# Patient Record
Sex: Male | Born: 1965 | Race: Black or African American | Hispanic: No | Smoking: Current every day smoker
Health system: Southern US, Community
[De-identification: ages and names within clinical notes are randomized; demographics above are authoritative.]

## PROBLEM LIST (undated history)

## (undated) DIAGNOSIS — G709 Myoneural disorder, unspecified: Secondary | ICD-10-CM

## (undated) DIAGNOSIS — M1990 Unspecified osteoarthritis, unspecified site: Secondary | ICD-10-CM

## (undated) DIAGNOSIS — L0390 Cellulitis, unspecified: Secondary | ICD-10-CM

## (undated) HISTORY — DX: Myoneural disorder, unspecified: G70.9

---

## 1997-09-01 ENCOUNTER — Emergency Department (HOSPITAL_COMMUNITY): Admission: EM | Admit: 1997-09-01 | Discharge: 1997-09-01 | Payer: Self-pay | Admitting: Emergency Medicine

## 1997-09-08 ENCOUNTER — Emergency Department (HOSPITAL_COMMUNITY): Admission: EM | Admit: 1997-09-08 | Discharge: 1997-09-08 | Payer: Self-pay | Admitting: Emergency Medicine

## 1999-07-20 ENCOUNTER — Encounter: Payer: Self-pay | Admitting: Emergency Medicine

## 1999-07-20 ENCOUNTER — Emergency Department (HOSPITAL_COMMUNITY): Admission: EM | Admit: 1999-07-20 | Discharge: 1999-07-20 | Payer: Self-pay | Admitting: Emergency Medicine

## 2000-08-31 ENCOUNTER — Encounter: Payer: Self-pay | Admitting: Emergency Medicine

## 2000-08-31 ENCOUNTER — Emergency Department (HOSPITAL_COMMUNITY): Admission: EM | Admit: 2000-08-31 | Discharge: 2000-08-31 | Payer: Self-pay | Admitting: Emergency Medicine

## 2001-02-17 ENCOUNTER — Emergency Department (HOSPITAL_COMMUNITY): Admission: EM | Admit: 2001-02-17 | Discharge: 2001-02-17 | Payer: Self-pay

## 2002-07-22 ENCOUNTER — Inpatient Hospital Stay (HOSPITAL_COMMUNITY): Admission: AC | Admit: 2002-07-22 | Discharge: 2002-07-26 | Payer: Self-pay

## 2002-07-22 ENCOUNTER — Encounter: Payer: Self-pay | Admitting: General Surgery

## 2002-07-23 ENCOUNTER — Encounter: Payer: Self-pay | Admitting: General Surgery

## 2002-07-24 ENCOUNTER — Encounter: Payer: Self-pay | Admitting: General Surgery

## 2002-07-25 ENCOUNTER — Encounter: Payer: Self-pay | Admitting: General Surgery

## 2002-07-26 ENCOUNTER — Encounter: Payer: Self-pay | Admitting: General Surgery

## 2002-08-01 ENCOUNTER — Emergency Department (HOSPITAL_COMMUNITY): Admission: EM | Admit: 2002-08-01 | Discharge: 2002-08-01 | Payer: Self-pay | Admitting: Emergency Medicine

## 2002-08-05 ENCOUNTER — Encounter: Payer: Self-pay | Admitting: Emergency Medicine

## 2002-08-05 ENCOUNTER — Inpatient Hospital Stay (HOSPITAL_COMMUNITY): Admission: EM | Admit: 2002-08-05 | Discharge: 2002-08-07 | Payer: Self-pay | Admitting: Emergency Medicine

## 2002-08-06 ENCOUNTER — Encounter: Payer: Self-pay | Admitting: General Surgery

## 2002-08-20 ENCOUNTER — Encounter: Payer: Self-pay | Admitting: Emergency Medicine

## 2002-08-21 ENCOUNTER — Inpatient Hospital Stay (HOSPITAL_COMMUNITY): Admission: AD | Admit: 2002-08-21 | Discharge: 2002-08-21 | Payer: Self-pay | Admitting: Emergency Medicine

## 2002-08-24 ENCOUNTER — Emergency Department (HOSPITAL_COMMUNITY): Admission: EM | Admit: 2002-08-24 | Discharge: 2002-08-25 | Payer: Self-pay | Admitting: Emergency Medicine

## 2002-10-20 ENCOUNTER — Encounter: Payer: Self-pay | Admitting: Emergency Medicine

## 2002-10-20 ENCOUNTER — Emergency Department (HOSPITAL_COMMUNITY): Admission: EM | Admit: 2002-10-20 | Discharge: 2002-10-20 | Payer: Self-pay | Admitting: *Deleted

## 2005-03-15 ENCOUNTER — Emergency Department: Payer: Self-pay | Admitting: Emergency Medicine

## 2006-11-07 ENCOUNTER — Emergency Department: Payer: Self-pay

## 2006-12-17 ENCOUNTER — Emergency Department: Payer: Self-pay | Admitting: Emergency Medicine

## 2016-12-21 ENCOUNTER — Emergency Department (HOSPITAL_COMMUNITY): Payer: Self-pay

## 2016-12-21 ENCOUNTER — Emergency Department (HOSPITAL_COMMUNITY)
Admission: EM | Admit: 2016-12-21 | Discharge: 2016-12-21 | Disposition: A | Discharge status: Home / Self Care | Payer: Self-pay | Attending: Emergency Medicine | Admitting: Emergency Medicine

## 2016-12-21 ENCOUNTER — Encounter (HOSPITAL_COMMUNITY): Payer: Self-pay

## 2016-12-21 DIAGNOSIS — Z5321 Procedure and treatment not carried out due to patient leaving prior to being seen by health care provider: Secondary | ICD-10-CM | POA: Insufficient documentation

## 2016-12-21 HISTORY — DX: Unspecified osteoarthritis, unspecified site: M19.90

## 2016-12-21 HISTORY — DX: Cellulitis, unspecified: L03.90

## 2016-12-21 NOTE — ED Notes (Addendum)
Called patient for vital sign recheck, no answer.

## 2016-12-21 NOTE — ED Triage Notes (Signed)
Pt states he has had right neck and shoulder pain with numbness into the arm X2 months. Pt states he has hx of cellulitis in the arm and lost use of the extremity. Pt afebrile. No swelling noted to the extremity.

## 2017-01-04 ENCOUNTER — Emergency Department (HOSPITAL_COMMUNITY): Payer: Self-pay

## 2017-01-04 ENCOUNTER — Emergency Department (HOSPITAL_COMMUNITY)
Admission: EM | Admit: 2017-01-04 | Discharge: 2017-01-04 | Disposition: A | Discharge status: Home / Self Care | Payer: Self-pay | Attending: Emergency Medicine | Admitting: Emergency Medicine

## 2017-01-04 ENCOUNTER — Encounter (HOSPITAL_COMMUNITY): Payer: Self-pay

## 2017-01-04 DIAGNOSIS — F17210 Nicotine dependence, cigarettes, uncomplicated: Secondary | ICD-10-CM | POA: Insufficient documentation

## 2017-01-04 DIAGNOSIS — M5412 Radiculopathy, cervical region: Secondary | ICD-10-CM | POA: Insufficient documentation

## 2017-01-04 MED ORDER — PREDNISONE 10 MG PO TABS: 40.0000 mg | ORAL_TABLET | Freq: Every day | ORAL | 0 refills | Status: AC

## 2017-01-04 MED ORDER — METHOCARBAMOL 500 MG PO TABS: 500.0000 mg | ORAL_TABLET | Freq: Three times a day (TID) | ORAL | 0 refills | Status: AC | PRN

## 2017-01-04 NOTE — ED Provider Notes (Signed)
[facility] Provider Note   CSN: [id number] Arrival date & time: 01/04/17  1205     History   Chief Complaint Chief Complaint  Patient presents with  . Shoulder Pain    HPI Carl Sharp is a 51 y.o. male with history of right hip osteoarthritis presents to the ED for evaluation of gradually worsening right-sided neck pain that radiates to right shoulder and right hand associated with "muscle tightness" in the right trapezius and intermittent tingling to thumb, index and middle fingers of the right hand. Pain is exacerbated with right neck rotation and bend, palpation and active movement of the right upper extremity above head level.States sometimes when he is trying to write things down he has difficulty grasping onto the pen. No numbness or weakness in RUE. Has been taking 600 mg of ibuprofen and 650 mg of Tylenol with minimal relief.Denies neck injury or trauma to neck or right upper extremity. He began using a cane on the right side in the last few months due to pain in his right hip from OA, he thinks that overusing his right upper extremity may be exacerbating his symptoms.  No fevers, chills, neck stiffness, headaches, generalized rashes, history of IV drug use, history of epidural abscesses, numbness or weakness to RUE. No previous injury or surgery to cervical spine.  HPI  Past Medical History:  Diagnosis Date  . Arthritis   . Cellulitis     There are no active problems to display for this patient.   History reviewed. No pertinent surgical history.     Home Medications    Prior to Admission medications   Medication Sig Start Date End Date Taking? Authorizing Provider  methocarbamol (ROBAXIN) 500 MG tablet Take 1 tablet (500 mg total) every 8 (eight) hours as needed for up to 5 days by mouth for muscle spasms. 01/04/17 01/09/17  Liberty HandyGibbons, Tayvin Preslar J, PA-C  predniSONE (DELTASONE) 10 MG tablet Take 4 tablets (40 mg total) daily  for 5 days by mouth. 01/04/17 01/09/17  Liberty HandyGibbons, Jury Caserta J, PA-C    Family History History reviewed. No pertinent family history.  Social History Social History   Tobacco Use  . Smoking status: Current Every Day Smoker    Packs/day: 0.25    Types: Cigarettes  . Smokeless tobacco: Never Used  Substance Use Topics  . Alcohol use: Yes    Comment: social   . Drug use: No     Allergies   Other   Review of Systems Review of Systems  Constitutional: Negative for chills and fever.  Musculoskeletal: Positive for arthralgias, myalgias and neck pain. Negative for back pain, gait problem and neck stiffness.  Skin: Negative for rash and wound.  Neurological: Negative for weakness, numbness and headaches.       +tingling in right index, middle, thumb     Physical Exam Updated Vital Signs BP 123/79   Pulse 79   Temp 98.6 F (37 C) (Oral)   Resp 18   Ht 6\' 1"  (1.854 m)   Wt 93 kg (205 lb)   SpO2 100%   BMI 27.05 kg/m   Physical Exam  Constitutional: He is oriented to person, place, and time. He appears well-developed and well-nourished. No distress.  NAD.  HENT:  Head: Normocephalic and atraumatic.  Right Ear: External ear normal.  Left Ear: External ear normal.  Nose: Nose normal.  Eyes: Conjunctivae are normal. No scleral icterus.  PERRL and EOMs intact bilaterally  Neck: Neck  supple. Spinous process tenderness and muscular tenderness present. Decreased range of motion present.  Midline c-spine tenderness along C5-6 w/o step offs or crepitus R sided paraspinal c-spine muscular tenderness Full PROM of neck, pain reported with right neck bend and rotation No meningeal signs   Cardiovascular: Normal rate, regular rhythm, normal heart sounds and intact distal pulses.  No murmur heard. Pulmonary/Chest: Effort normal and breath sounds normal. He has no wheezes.  Musculoskeletal: He exhibits no deformity.       Right shoulder: He exhibits decreased range of motion and  tenderness.  Negative Adson's, ROOST and Spurling's, lift off Positive empty can test, hawkin's and neer's Diffuse tenderness along R trapezius and lateral deltoid Full PROM of right shoulder, pain with ABD and extension Full and painless PROM of R elbow and wrist   Neurological: He is alert and oriented to person, place, and time.  Sensation to light touch intact in medial, ulnar and radial nerve distribution in RUE 5/5 strength with hand grip bilaterally   Skin: Skin is warm and dry. Capillary refill takes less than 2 seconds.  Psychiatric: He has a normal mood and affect. His behavior is normal. Judgment and thought content normal.  Nursing note and vitals reviewed.    ED Treatments / Results  Labs (all labs ordered are listed, but only abnormal results are displayed) Labs Reviewed - No data to display  EKG  EKG Interpretation None       Radiology Dg Shoulder Right  Result Date: 01/04/2017 CLINICAL DATA:  Right shoulder pain EXAM: RIGHT SHOULDER - 2+ VIEW COMPARISON:  12/21/2016 FINDINGS: Normal alignment and no fracture. Mild degenerative change in the [facility] joint otherwise negative IMPRESSION: Mild AC degenerative change.  No acute abnormality. Electronically Signed   By: Marlan Palauharles  Clark M.D.   On: 01/04/2017 13:08   Ct Cervical Spine Wo Contrast  Result Date: 01/04/2017 CLINICAL DATA:  51 year old male with right shoulder and arm pain. Recently diagnosed with right hip osteoarthritis an uses right-sided cane. EXAM: CT CERVICAL SPINE WITHOUT CONTRAST TECHNIQUE: Multidetector CT imaging of the cervical spine was performed without intravenous contrast. Multiplanar CT image reconstructions were also generated. COMPARISON:  None. FINDINGS: Alignment: Slight straightening and minimal curvature cervical spine convex right. Slight rotation of C1 upon C2 felt be related head positioning. Skull base and vertebrae: No cervical spine fracture. Soft tissues and spinal canal: No abnormal  prevertebral soft tissue swelling. Disc levels: Minimal degenerative changes C1-2 articulation. Cervicomedullary junction unremarkable. C2-3:  Negative. C3-4:  Negative. C4-5:  Minimal uncinate hypertrophy.  Minimal foraminal narrowing. C5-6:  Mild bulge.  Mild narrowing ventral thecal sac. C6-7: Bulge slightly greater to left. Mild narrowing ventral thecal sac greater on left. Mild left foraminal narrowing. C7-T1: Minimal bulge. Minimal spur. Right uncinate hypertrophy. Mild right foraminal narrowing. Upper chest: Negative Other: Negative. IMPRESSION: C4-5 minimal uncinate hypertrophy. Minimal bilateral foraminal narrowing. C5-6 mild bulge.  Mild narrowing ventral thecal sac. C6-7 bulge slightly greater to left. Mild narrowing ventral thecal sac greater on left. Mild left foraminal narrowing. C7-T1 minimal bulge. Minimal spur. Right uncinate hypertrophy. Mild right foraminal narrowing. Slight straightening and minimal curvature cervical spine convex right. Slight rotation of C1 upon  C2 felt be related head positioning. No cervical spine fracture. Electronically Signed   By: Lacy DuverneySteven  Olson M.D.   On: 01/04/2017 14:58    Procedures Procedures (including critical care time)  Medications Ordered in ED Medications - No data to display   Initial Impression / Assessment and Plan /  ED Course  I have reviewed the triage vital signs and the nursing notes.  Pertinent labs & imaging results that were available during my care of the patient were reviewed by me and considered in my medical decision making (see chart for details).     51 y.o. yo male with pertinent pmh presents with right sided neck pain associated with paresthesias down to thumb, index and middle finger for several months.  Recently began using cane on right hand for right hip OA, but no other antecedent event, fall, injury to neck or RUE. VS reassuring.  There is tenderness between spinous processes of c-spine and right paraspinal muscles.   There is no focal weakness to RUE.  No gait disturbances, sensory loss, weakness, muscle atrophy.  No recent fevers, chills, immunosuppression, cancer or IVDU.  CT cervical spine today shows mild foraminal stenosis to R and L side at C7-T1.  Given first time symptoms, reassuring physical exam findings will treat for cervical radiculopathy with oral analgesics, prednisone and muscle relaxers.  Advised patient to avoid aggravating activities until symptoms start to improve.  Educated patient on red flag symptoms that would warrant return to ED, patient verbalized understanding.  Patient advised to f/u with PCP for possibly PT and long term treatment of symptoms.   Final Clinical Impressions(s) / ED Diagnoses   Final diagnoses:  Cervical radiculopathy    ED Discharge Orders        Ordered    predniSONE (DELTASONE) 10 MG tablet  Daily     01/04/17 1528    methocarbamol (ROBAXIN) 500 MG tablet  Every 8 hours PRN     01/04/17 1528       Liberty Handy, PA-C 01/04/17 2040    Melene Plan, DO 01/05/17 1504

## 2017-01-04 NOTE — Discharge Instructions (Signed)
Our CT scan of your cervical spine showed some mild disc degenerative changes commonly seen with aging. You have very mild narrowing at nerve C6-C7 and C7-T1 which could explain your symptoms. Given CT scan, symptoms and exam I suspect you have cervical radiculopathy, this happens when there is an inflamed nerve in your cervical spine. This is similar to sciatica that you have on your right leg.  Please take prednisone for anti-inflammatory properties. Take robaxin 500 mg three times daily to help with associated muscle spasms and tightness to your trapezius and neck.  For pain, take 600 mg ibuprofen and 1000 mg acetaminophen every 6-8 hours. Follow-up with your primary care provider within one to 2 weeks for reevaluation.  Contact [facility] to establish care with a primary care provider for regular, routine medical care.  This clinic accepts patients without medical insurance. A primary care provider can adjust your daily medications, give you refills, send referrals to specialists and perform physical exams and wellness checks.

## 2017-01-04 NOTE — ED Notes (Signed)
Patient transported to CT 

## 2017-01-04 NOTE — ED Triage Notes (Signed)
Pt arrived from home c/o right shoulder and arm pain.  States he was recently diagnosed with right hip osteoarthritis and uses a cane on right side also.

## 2017-03-30 ENCOUNTER — Emergency Department (HOSPITAL_COMMUNITY)
Admission: EM | Admit: 2017-03-30 | Discharge: 2017-03-30 | Disposition: A | Discharge status: Home / Self Care | Payer: Self-pay | Attending: Emergency Medicine | Admitting: Emergency Medicine

## 2017-03-30 ENCOUNTER — Other Ambulatory Visit: Payer: Self-pay

## 2017-03-30 ENCOUNTER — Encounter (HOSPITAL_COMMUNITY): Payer: Self-pay

## 2017-03-30 DIAGNOSIS — F17210 Nicotine dependence, cigarettes, uncomplicated: Secondary | ICD-10-CM | POA: Insufficient documentation

## 2017-03-30 DIAGNOSIS — M5431 Sciatica, right side: Secondary | ICD-10-CM | POA: Insufficient documentation

## 2017-03-30 DIAGNOSIS — M1611 Unilateral primary osteoarthritis, right hip: Secondary | ICD-10-CM | POA: Insufficient documentation

## 2017-03-30 MED ORDER — METHYLPREDNISOLONE 4 MG PO TBPK: ORAL_TABLET | ORAL | 0 refills | Status: AC

## 2017-03-30 NOTE — ED Provider Notes (Signed)
[facility] Provider Note   CSN: [id number] Arrival date & time: 03/30/17  [id number]     History   Chief Complaint No chief complaint on file.   HPI Carl Sharp is a 52 y.o. male with past medical history of osteoarthritis, who presents to ED for evaluation of continued right hip pain.  He states that he was diagnosed with osteoarthritis of the right hip in 2018.  He has been taking his home narcotic pain medication with no improvement in his symptoms.  He states that now he is having sharp shooting pain from the right side of his back down to his feet.  His pain has been constant and worse with movement.  He has been using a cane to ambulate since last year.  Denies any additional injuries or falls.  He denies any midline back pain, numbness to legs, loss of bowel or bladder function, prior back surgeries, history of cancer, history of IV drug use or fevers.  HPI  Past Medical History:  Diagnosis Date  . Arthritis   . Cellulitis     There are no active problems to display for this patient.   History reviewed. No pertinent surgical history.     Home Medications    Prior to Admission medications   Medication Sig Start Date End Date Taking? Authorizing Provider  methylPREDNISolone (MEDROL DOSEPAK) 4 MG TBPK tablet Taper over 6 days. 03/30/17   Dietrich PatesKhatri, Ceaira Ernster, PA-C    Family History No family history on file.  Social History Social History   Tobacco Use  . Smoking status: Current Every Day Smoker    Packs/day: 0.25    Types: Cigarettes  . Smokeless tobacco: Never Used  Substance Use Topics  . Alcohol use: Yes    Comment: social   . Drug use: No     Allergies   Other   Review of Systems Review of Systems  Constitutional: Negative for chills and fever.  Musculoskeletal: Positive for arthralgias, gait problem and myalgias. Negative for back pain, joint swelling, neck pain and neck stiffness.  Skin: Negative for color  change and wound.  Neurological: Negative for weakness and numbness.     Physical Exam Updated Vital Signs BP 127/90 (BP Location: Right Arm)   Pulse 88   Temp (!) 97.3 F (36.3 C) (Oral)   Resp 18   SpO2 99%   Physical Exam  Constitutional: He appears well-developed and well-nourished. No distress.  Nontoxic appearing and in no acute distress.  Ambulatory with cane.  HENT:  Head: Normocephalic and atraumatic.  Eyes: Conjunctivae and EOM are normal. No scleral icterus.  Neck: Normal range of motion.  Pulmonary/Chest: Effort normal. No respiratory distress.  Musculoskeletal:       Legs: Able to perform full active and passive range of motion of right hip and knee with no visible deformity or pelvic instability noted.  No midline spinal tenderness present in lumbar, thoracic or cervical spine. No step-off palpated. No visible bruising, edema or temperature change noted. No objective signs of numbness present. No saddle anesthesia. 2+ DP pulses bilaterally. Sensation intact to light touch. Strength 5/5 in bilateral lower extremities.  Neurological: He is alert.  Skin: No rash noted. He is not diaphoretic.  Psychiatric: He has a normal mood and affect.  Nursing note and vitals reviewed.    ED Treatments / Results  Labs (all labs ordered are listed, but only abnormal results are displayed) Labs Reviewed - No data to display  EKG  EKG Interpretation None       Radiology No results found.  Procedures Procedures (including critical care time)  Medications Ordered in ED Medications - No data to display   Initial Impression / Assessment and Plan / ED Course  I have reviewed the triage vital signs and the nursing notes.  Pertinent labs & imaging results that were available during my care of the patient were reviewed by me and considered in my medical decision making (see chart for details).     Patient presents to ED for evaluation of ongoing right hip pain due to  osteoarthritis.  He denies any new injuries, trauma to area, midline back pain, prior back surgeries, history of cancer, history of IV drug use, loss of sensation or loss of bowel or bladder function.  He states that his knee symptoms include sharp, shooting pain originating from his right buttock down to his right leg.  He takes home narcotic pain medication with no improvement in his symptoms.  He has discussed with his pain specialist that a hip replacement surgery could be that the only cure to his symptoms.  He has no midline spinal tenderness present or signs of loss of sensation on exam.  He has been ambulatory with cane since last year.  I suspect sciatica and osteoarthritis as a cause of his pain rather than infectious or injury to spinal cord as a cause of his symptoms.  Will give steroids and advised him to continue his home medications as previously prescribed.  Advised heat therapy and stretching as well.  Patient appears stable for discharge at this time.  Strict return precautions given.  Portions of this note were generated with Scientist, clinical (histocompatibility and immunogenetics). Dictation errors may occur despite best attempts at proofreading.   Final Clinical Impressions(s) / ED Diagnoses   Final diagnoses:  Sciatica of right side  Osteoarthritis of right hip, unspecified osteoarthritis type    ED Discharge Orders        Ordered    methylPREDNISolone (MEDROL DOSEPAK) 4 MG TBPK tablet     03/30/17 [id number] PA-C 03/30/17 1052    Azalia Bilis, MD 03/30/17 1659

## 2017-03-30 NOTE — Discharge Instructions (Signed)
Please read attached information regarding her condition. Use steroids and taper Dosepak as directed. Continue your home medications as previously prescribed. Follow-up with orthopedic specialist listed below for further evaluation. Return to ED for worsening symptoms, severe back pain, injuries or falls, loss of bowel or bladder function, numbness in legs.

## 2017-03-30 NOTE — ED Triage Notes (Signed)
Patient complains of ongoing arthritis pain to right hip. Diagnosed in 2018. Pain with ROM, no new trauma. Alert and oriented, pain radiating down back of right leg

## 2017-04-05 ENCOUNTER — Ambulatory Visit (INDEPENDENT_AMBULATORY_CARE_PROVIDER_SITE_OTHER): Payer: Self-pay | Admitting: Orthopaedic Surgery

## 2017-04-05 ENCOUNTER — Ambulatory Visit (INDEPENDENT_AMBULATORY_CARE_PROVIDER_SITE_OTHER): Payer: Self-pay

## 2017-04-05 ENCOUNTER — Encounter (INDEPENDENT_AMBULATORY_CARE_PROVIDER_SITE_OTHER): Payer: Self-pay | Admitting: Orthopaedic Surgery

## 2017-04-05 DIAGNOSIS — M25551 Pain in right hip: Secondary | ICD-10-CM

## 2017-04-05 DIAGNOSIS — M5416 Radiculopathy, lumbar region: Secondary | ICD-10-CM

## 2017-04-05 NOTE — Progress Notes (Signed)
Office Visit Note   Patient: Carl Sharp           Date of Birth: 12/15/1965           MRN: [id number] Visit Date: 04/05/2017              Requested by: No referring provider defined for this encounter. PCP: Patient, No Pcp Per   Assessment & Plan: Visit Diagnoses:  1. Pain in right hip   2. Lumbar radiculopathy     Plan: Impression is lumbar radiculopathy possible hip arthritis.  Recommend MRI of lumbar spine to rule out structural abnormalities.  Follow-up after the MRI.  Follow-Up Instructions: Return in about 2 weeks (around 04/19/2017).   Orders:  Orders Placed This Encounter  Procedures  . XR HIP UNILAT W OR W/O PELVIS 2-3 VIEWS RIGHT  . XR Lumbar Spine 2-3 Views  . MR Lumbar Spine w/o contrast   No orders of the defined types were placed in this encounter.     Procedures: No procedures performed   Clinical Data: No additional findings.   Subjective: Chief Complaint  Patient presents with  . Right Hip - Pain  . Lower Back - Pain    Patient is a 52 year old gentleman who comes in with right hip and radicular pain.  He endorses pain that radiates to the leg and into the groin.  His pain is radicular and will sometimes radiate into both legs.  Denies any injuries.  Endorses numbness and tingling.  He has worsening pain with increased activity.  He is ambulating with a cane.  [place]    Review of Systems  Constitutional: Negative.   All other systems reviewed and are negative.    Objective: Vital Signs: There were no vitals taken for this visit.  Physical Exam  Constitutional: He is oriented to person, place, and time. He appears well-developed and well-nourished.  HENT:  Head: Normocephalic and atraumatic.  Eyes: Pupils are equal, round, and reactive to light.  Neck: Neck supple.  Pulmonary/Chest: Effort normal.  Abdominal: Soft.  Musculoskeletal: Normal range of motion.  Neurological: He is alert and oriented  to person, place, and time.  Skin: Skin is warm.  Psychiatric: He has a normal mood and affect. His behavior is normal. Judgment and thought content normal.  Nursing note and vitals reviewed.   Ortho Exam Right hip exam shows minimal discomfort with rotation.  He has mild tenderness along the trochanteric bursa.  Negative sciatic tension sign.  Slight weakness with hip flexion but mainly due to poor participation and guarding. Specialty Comments:  No specialty comments available.  Imaging: Xr Hip Unilat W Or W/o Pelvis 2-3 Views Right  Result Date: 04/05/2017 No acute or structural abnormalities.  Minimal joint space narrowing of his hip joints  Xr Lumbar Spine 2-3 Views  Result Date: 04/05/2017 Advanced degenerative disc disease L4-L5.  Mild degenerative disc disease at other levels.    PMFS History: There are no active problems to display for this patient.  Past Medical History:  Diagnosis Date  . Arthritis   . Cellulitis     History reviewed. No pertinent family history.  History reviewed. No pertinent surgical history. Social History   Occupational History  . Not on file  Tobacco Use  . Smoking status: Current Every Day Smoker    Packs/day: 0.25    Types: Cigarettes  . Smokeless tobacco: Never Used  Substance and Sexual Activity  . Alcohol use: Yes  Comment: social   . Drug use: No  . Sexual activity: Not on file

## 2017-04-23 ENCOUNTER — Ambulatory Visit
Admission: RE | Admit: 2017-04-23 | Discharge: 2017-04-23 | Disposition: A | Discharge status: Home / Self Care | Payer: Self-pay | Source: Ambulatory Visit | Attending: Orthopaedic Surgery | Admitting: Orthopaedic Surgery

## 2017-04-23 DIAGNOSIS — M5416 Radiculopathy, lumbar region: Secondary | ICD-10-CM

## 2017-04-26 ENCOUNTER — Encounter (INDEPENDENT_AMBULATORY_CARE_PROVIDER_SITE_OTHER): Payer: Self-pay | Admitting: Orthopaedic Surgery

## 2017-04-26 ENCOUNTER — Ambulatory Visit (INDEPENDENT_AMBULATORY_CARE_PROVIDER_SITE_OTHER): Payer: Self-pay | Admitting: Orthopaedic Surgery

## 2017-04-26 DIAGNOSIS — M545 Low back pain: Secondary | ICD-10-CM

## 2017-04-26 MED ORDER — MELOXICAM 7.5 MG PO TABS: 7.5000 mg | ORAL_TABLET | Freq: Every day | ORAL | 2 refills | Status: AC | PRN

## 2017-04-26 NOTE — Progress Notes (Signed)
      Patient: Carl Sharp           Date of Birth: 07/21/1965           MRN: [id number] Visit Date: 04/26/2017 PCP: Patient, No Pcp Per   Assessment & Plan:  Chief Complaint:  Chief Complaint  Patient presents with  . Lower Back - Pain, Follow-up   Visit Diagnoses:  1. Low back pain, unspecified back pain laterality, unspecified chronicity, with sciatica presence unspecified     Plan: Carl SchwabDonovan comes in today to discuss MRI results of his lumbar spine.  He continues to have pain to the right lateral hip and groin which radiates down the lateral aspect of the entire right leg.  He does note numbness to the top of his foot.  He is starting to get a little weak as well.  He is completed a steroid Dosepak with minimal relief of symptoms.  MRI from 04/23/2017 shows multilevel degenerative disc disease and facet disease with focal findings to include an L4-5 disc protrusion as well as an L5-S1 disc bulge.  At this point, I think he has a few things going on to include right hip osteoarthritis as well as the above lumbar findings.  We are going to refer him to Dr. Alvester MorinNewton for epidural steroid injections.  If these fail to relieve his symptoms we may entertain an intra-articular cortisone injection to the right hip.  He will follow-up with us 4-6 weeks after his epidural steroid injection.  Follow-Up Instructions: Return in about 6 weeks (around 06/07/2017).   Orders:  Orders Placed This Encounter  Procedures  . Ambulatory referral to Physical Medicine Rehab   Meds ordered this encounter  Medications  . meloxicam (MOBIC) 7.5 MG tablet    Sig: Take 1 tablet (7.5 mg total) by mouth daily as needed for pain.    Dispense:  30 tablet    Refill:  2    Imaging: No results found.  PMFS History: There are no active problems to display for this patient.  Past Medical History:  Diagnosis Date  . Arthritis   . Cellulitis     History reviewed. No pertinent family history.  History reviewed.  No pertinent surgical history. Social History   Occupational History  . Not on file  Tobacco Use  . Smoking status: Current Every Day Smoker    Packs/day: 0.25    Types: Cigarettes  . Smokeless tobacco: Never Used  Substance and Sexual Activity  . Alcohol use: Yes    Comment: social   . Drug use: No  . Sexual activity: Not on file

## 2017-05-16 ENCOUNTER — Telehealth (INDEPENDENT_AMBULATORY_CARE_PROVIDER_SITE_OTHER): Payer: Self-pay | Admitting: Orthopaedic Surgery

## 2017-05-19 ENCOUNTER — Encounter (INDEPENDENT_AMBULATORY_CARE_PROVIDER_SITE_OTHER): Payer: Self-pay | Admitting: Orthopaedic Surgery

## 2017-05-19 ENCOUNTER — Ambulatory Visit (INDEPENDENT_AMBULATORY_CARE_PROVIDER_SITE_OTHER): Payer: Self-pay | Admitting: Orthopaedic Surgery

## 2017-05-19 DIAGNOSIS — M25551 Pain in right hip: Secondary | ICD-10-CM

## 2017-05-19 DIAGNOSIS — M5416 Radiculopathy, lumbar region: Secondary | ICD-10-CM

## 2017-05-19 MED ORDER — TRAMADOL HCL 50 MG PO TABS: 50.0000 mg | ORAL_TABLET | Freq: Three times a day (TID) | ORAL | 2 refills | Status: AC | PRN

## 2017-05-19 NOTE — Progress Notes (Signed)
   Office Visit Note   Patient: Carl PaceDonavon W Erbe           Date of Birth: 04/17/1965           MRN: [id number] Visit Date: 05/19/2017              Requested by: No referring provider defined for this encounter. PCP: Patient, No Pcp Per   Assessment & Plan: Visit Diagnoses:  1. Lumbar radiculopathy   2. Pain in right hip     Plan: Patient's lumbar ESI is scheduled for later this month.  Patient instructed to follow-up if he is no better from the injection.  Prescription for tramadol.  Questions encouraged and answered.  Follow-up as needed.  Follow-Up Instructions: Return if symptoms worsen or fail to improve.   Orders:  No orders of the defined types were placed in this encounter.  Meds ordered this encounter  Medications  . traMADol (ULTRAM) 50 MG tablet    Sig: Take 1-2 tablets (50-100 mg total) by mouth 3 (three) times daily as needed.    Dispense:  30 tablet    Refill:  2      Procedures: No procedures performed   Clinical Data: No additional findings.   Subjective: Chief Complaint  Patient presents with  . Lower Back - Pain  . Right Leg - Pain    Patient comes in today for continued low back pain.  He has not had his epidural steroid injection yet   Review of Systems   Objective: Vital Signs: There were no vitals taken for this visit.  Physical Exam  Ortho Exam Stable nonfocal exam Specialty Comments:  No specialty comments available.  Imaging: No results found.   PMFS History: There are no active problems to display for this patient.  Past Medical History:  Diagnosis Date  . Arthritis   . Cellulitis     History reviewed. No pertinent family history.  History reviewed. No pertinent surgical history. Social History   Occupational History  . Not on file  Tobacco Use  . Smoking status: Current Every Day Smoker    Packs/day: 0.25    Types: Cigarettes  . Smokeless tobacco: Never Used  Substance and Sexual Activity  . Alcohol use:  Yes    Comment: social   . Drug use: No  . Sexual activity: Not on file

## 2017-05-25 ENCOUNTER — Encounter (INDEPENDENT_AMBULATORY_CARE_PROVIDER_SITE_OTHER): Payer: Self-pay | Admitting: Physical Medicine and Rehabilitation

## 2017-05-26 ENCOUNTER — Ambulatory Visit (INDEPENDENT_AMBULATORY_CARE_PROVIDER_SITE_OTHER): Payer: Self-pay | Admitting: Physical Medicine and Rehabilitation

## 2017-05-26 ENCOUNTER — Encounter (INDEPENDENT_AMBULATORY_CARE_PROVIDER_SITE_OTHER): Payer: Self-pay

## 2017-05-26 DIAGNOSIS — M47816 Spondylosis without myelopathy or radiculopathy, lumbar region: Secondary | ICD-10-CM

## 2017-05-26 DIAGNOSIS — M545 Low back pain: Secondary | ICD-10-CM

## 2017-05-26 DIAGNOSIS — G8929 Other chronic pain: Secondary | ICD-10-CM

## 2017-05-26 NOTE — Progress Notes (Signed)
 .  Numeric Pain Rating Scale and Functional Assessment Average Pain 9   In the last MONTH (on 0-10 scale) has pain interfered with the following?  1. General activity like being  able to carry out your everyday physical activities such as walking, climbing stairs, carrying groceries, or moving a chair?  Rating(9)   +Driver, -BT, -Dye Allergies.  

## 2017-06-06 ENCOUNTER — Encounter (INDEPENDENT_AMBULATORY_CARE_PROVIDER_SITE_OTHER): Payer: Self-pay | Admitting: Physical Medicine and Rehabilitation

## 2017-06-06 ENCOUNTER — Ambulatory Visit (INDEPENDENT_AMBULATORY_CARE_PROVIDER_SITE_OTHER): Payer: Self-pay | Admitting: Physical Medicine and Rehabilitation

## 2017-06-06 ENCOUNTER — Ambulatory Visit (INDEPENDENT_AMBULATORY_CARE_PROVIDER_SITE_OTHER): Payer: Self-pay

## 2017-06-06 VITALS — BP 122/83 | HR 76 | Temp 98.6°F

## 2017-06-06 DIAGNOSIS — M545 Low back pain, unspecified: Secondary | ICD-10-CM

## 2017-06-06 DIAGNOSIS — G8929 Other chronic pain: Secondary | ICD-10-CM

## 2017-06-06 DIAGNOSIS — M47816 Spondylosis without myelopathy or radiculopathy, lumbar region: Secondary | ICD-10-CM

## 2017-06-06 MED ORDER — METHYLPREDNISOLONE ACETATE 80 MG/ML IJ SUSP
80.0000 mg | Freq: Once | INTRAMUSCULAR | Status: AC
  Administered 2017-06-06: 80 mg

## 2017-06-06 NOTE — Progress Notes (Signed)
.  Numeric Pain Rating Scale and Functional Assessment Average Pain 9   In the last MONTH (on 0-10 scale) has pain interfered with the following?  1. General activity like being  able to carry out your everyday physical activities such as walking, climbing stairs, carrying groceries, or moving a chair?  Rating(8)   +Driver, -BT, -Dye Allergies.  

## 2017-06-06 NOTE — Patient Instructions (Signed)

## 2017-06-06 NOTE — Procedures (Signed)
Lumbar Facet Joint Intra-Articular Injection(s) with Fluoroscopic Guidance  Patient: Carl Sharp      Date of Birth: 04/15/1965 MRN: [id number] PCP: Patient, No Pcp Per      Visit Date: 06/06/2017   Universal Protocol:    Date/Time: 06/06/2017  Consent Given By: the patient  Position: PRONE   Additional Comments: Vital signs were monitored before and after the procedure. Patient was prepped and draped in the usual sterile fashion. The correct patient, procedure, and site was verified.   Injection Procedure Details:  Procedure Site One Meds Administered:  Meds ordered this encounter  Medications  . methylPREDNISolone acetate (DEPO-MEDROL) injection 80 mg     Laterality: Bilateral  Location/Site:  L4-L5  Needle size: 22 guage  Needle type: Spinal  Needle Placement: Articular  Findings:  -Comments: Excellent flow of contrast producing a partial arthrogram.  Procedure Details: The fluoroscope beam is vertically oriented in AP, and the inferior recess is visualized beneath the lower pole of the inferior apophyseal process, which represents the target point for needle insertion. When direct visualization is difficult the target point is located at the medial projection of the vertebral pedicle. The region overlying each aforementioned target is locally anesthetized with a 1 to 2 ml. volume of 1% Lidocaine without Epinephrine.   The spinal needle was inserted into each of the above mentioned facet joints using biplanar fluoroscopic guidance. A 0.25 to 0.5 ml. volume of Isovue-250 was injected and a partial facet joint arthrogram was obtained. A single spot film was obtained of the resulting arthrogram.    One to 1.25 ml of the steroid/anesthetic solution was then injected into each of the facet joints noted above.   Additional Comments:  The patient tolerated the procedure well Dressing: Band-Aid    Post-procedure details: Patient was observed during the  procedure. Post-procedure instructions were reviewed.  Patient left the clinic in stable condition.

## 2017-06-09 ENCOUNTER — Encounter (INDEPENDENT_AMBULATORY_CARE_PROVIDER_SITE_OTHER): Payer: Self-pay | Admitting: Physical Medicine and Rehabilitation

## 2017-06-09 NOTE — Progress Notes (Signed)
Carl Sharp - 52 y.o. male MRN [id number]  Date of birth: 03/25/1965  Office Visit Note: Visit Date: 05/26/2017 PCP: Patient, No Pcp Per Referred by: No ref. provider found  Subjective: Chief Complaint  Patient presents with  . Lower Back - Pain   HPI: Carl Sharp is a 52 year old gentleman referred by Dr. Roda Sharp for chronic history of mostly low back pain right more than left.  He has been using tramadol as well as prednisone with only mild relief.  He gets occasional referral to the right leg.  This is the ligament going on for 2-3 years.  He reports really anything from activity makes it worse and really nothing has made it much better.  His average pain rating is a 9.  MRI was obtained and reviewed below and reviewed with the patient.  He has mostly facet arthritis which is pretty significant with straightening of the normal lordosis.  He does have some lateral recess narrowing left more than right at L4-5 and small protrusion at L5-S1 again to the left.  He is not really having great amount of leg pain and when he has it is the right.   Review of Systems  Constitutional: Negative for chills, fever and weight loss.  Musculoskeletal: Positive for back pain.  Neurological: Negative for tingling and weakness.   Otherwise per HPI.  Assessment & Plan: Visit Diagnoses:  1. Spondylosis without myelopathy or radiculopathy, lumbar region   2. Chronic bilateral low back pain without sciatica     Plan: Findings:  Imaging and exam findings consistent mostly with facet mediated back pain with perhaps occasional radicular type pain but that is not his biggest complaint.  He has significant arthritis for his age.  Would look at diagnostic medial branch/facet joint blocks at L4-5.  In the future might consider radiofrequency ablation.  If he gets relief but still has ongoing leg pain that worsens although is very intermittent could consider epidural injection.  Regrouping with physical therapist also  an option.    Meds & Orders: No orders of the defined types were placed in this encounter.  No orders of the defined types were placed in this encounter.   Follow-up: Return for bilateral L4-5 facet.   Procedures: No procedures performed  No notes on file   Clinical History: MRI LUMBAR SPINE WITHOUT CONTRAST  TECHNIQUE: Multiplanar, multisequence MR imaging of the lumbar spine was performed. No intravenous contrast was administered.  COMPARISON:  Radiography 04/05/2017  FINDINGS: Segmentation:  5 lumbar type vertebral bodies.  Alignment: Minimal curvature convex to the right. Straightening of the normal lumbar lordosis.  Vertebrae:  No fracture or primary bone lesion.  Conus medullaris and cauda equina: Conus extends to the L1 level. Conus and cauda equina appear normal.  Paraspinal and other soft tissues: Negative  Disc levels:  T12-L1: Normal.  L1-2: Shallow broad-based disc protrusion indents the ventral thecal sac but does not cause neural compression.  L2-3: Mild bulging of the disc.  No stenosis.  L3-4: Normal appearance of the disc.  Mild facet osteoarthritis.  L4-5: Shallow protrusion of the disc indents the thecal sac mildly and causes mild narrowing of the lateral recesses and foramina. More encroachment upon the left L4 nerve than the right. Bilateral facet arthritis with edema which could contribute to back pain or referred facet syndrome pain.  L5-S1: Disc bulge more prominent towards the left, contacting the left S1 nerve but without evidence of compression or displacement. No significant foraminal narrowing. Mild  bilateral facet arthritis.  IMPRESSION: Straightening of the normal lumbar lordosis. 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 Focal findings as below.  L4-5 shallow disc protrusion. Mild stenosis of both lateral recesses and neural foramina. Foraminal encroachment  slightly more pronounced on the left. Either L4 nerve root could be affected, more likely the left. Facet arthritis at this level could be associated with pain.  L5-S1 disc bulge more prominent towards the left, contacting the left S1 nerve but not grossly compressing it. This could cause left S1 nerve irritation.   Electronically Signed   By: Paulina FusiMark  Shogry M.D.   On: 04/24/2017 07:13   He reports that he has been smoking cigarettes.  He has been smoking about 0.25 packs per day. He has never used smokeless tobacco. No results for input(s): HGBA1C, LABURIC in the last 8760 hours.  Objective:  VS:  HT:    WT:   BMI:     BP:   HR: bpm  TEMP: ( )  RESP:  Physical Exam  Constitutional: He is oriented to person, place, and time. He appears well-developed and well-nourished.  Cardiovascular: Normal rate and regular rhythm.  Pulmonary/Chest: Effort normal.  Musculoskeletal:  Patient ambulates without aid and is somewhat slow to rise from a seated position.  He does have concordant pain with facet joint loading of the lumbar area.  No pain over the greater trochanters and good distal strength.  Neurological: He is alert and oriented to person, place, and time. He exhibits normal muscle tone. Coordination normal.    Ortho Exam Imaging: No results found.  Past Medical/Family/Surgical/Social History: Medications & Allergies reviewed per EMR, new medications updated. There are no active problems to display for this patient.  Past Medical History:  Diagnosis Date  . Arthritis   . Cellulitis    History reviewed. No pertinent family history. History reviewed. No pertinent surgical history. Social History   Occupational History  . Not on file  Tobacco Use  . Smoking status: Current Every Day Smoker    Packs/day: 0.25    Types: Cigarettes  . Smokeless tobacco: Never Used  Substance and Sexual Activity  . Alcohol use: Yes    Comment: social   . Drug use: No  . Sexual  activity: Not on file

## 2017-06-10 NOTE — Progress Notes (Signed)
Carl Sharp - 52 y.o. male Carl Sharp  Date of birth: 27-Jul-1965  Office Visit Note: Visit Date: 06/06/2017 PCP: Patient, No Pcp Per Referred by: No ref. provider found  Subjective: Chief Complaint  Patient presents with  . Lower Back - Pain   HPI: Carl Sharp is a 52 year old gentleman followed by Dr. Roda Sharp and whom I saw last week for quick evaluation of his lumbar spine.  The most significant finding in the MRI is really facet arthritis at L4-5 with edema.  He does not have much in the way of nerve compression and there is no central canal stenosis.  He does have some foraminal and small disc bulge but again no real frank compression.  He rates his pain as a 9 out of 10 and really functionally limiting.  He is using tramadol.  We will go ahead and complete the bilateral L4-5 facet joint blocks diagnostically and hopefully therapeutically.   ROS Otherwise per HPI.  Assessment & Plan: Visit Diagnoses:  1. Spondylosis without myelopathy or radiculopathy, lumbar region   2. Chronic bilateral low back pain without sciatica     Plan: No additional findings.   Meds & Orders:  Meds ordered this encounter  Medications  . methylPREDNISolone acetate (DEPO-MEDROL) injection 80 mg    Orders Placed This Encounter  Procedures  . Facet Injection  . XR C-ARM NO REPORT    Follow-up: Return if symptoms worsen or fail to improve, for Dr. Roda Sharp.   Procedures: No procedures performed  Lumbar Facet Joint Intra-Articular Injection(s) with Fluoroscopic Guidance  Patient: Carl Sharp      Date of Birth: Jul 20, 1965 Carl: [id number] PCP: Patient, No Pcp Per      Visit Date: 06/06/2017   Universal Protocol:    Date/Time: 06/06/2017  Consent Given By: the patient  Position: PRONE   Additional Comments: Vital signs were monitored before and after the procedure. Patient was prepped and draped in the usual sterile fashion. The correct patient, procedure, and site was  verified.   Injection Procedure Details:  Procedure Site One Meds Administered:  Meds ordered this encounter  Medications  . methylPREDNISolone acetate (DEPO-MEDROL) injection 80 mg     Laterality: Bilateral  Location/Site:  L4-L5  Needle size: 22 guage  Needle type: Spinal  Needle Placement: Articular  Findings:  -Comments: Excellent flow of contrast producing a partial arthrogram.  Procedure Details: The fluoroscope beam is vertically oriented in AP, and the inferior recess is visualized beneath the lower pole of the inferior apophyseal process, which represents the target point for needle insertion. When direct visualization is difficult the target point is located at the medial projection of the vertebral pedicle. The region overlying each aforementioned target is locally anesthetized with a 1 to 2 ml. volume of 1% Lidocaine without Epinephrine.   The spinal needle was inserted into each of the above mentioned facet joints using biplanar fluoroscopic guidance. A 0.25 to 0.5 ml. volume of Isovue-250 was injected and a partial facet joint arthrogram was obtained. A single spot film was obtained of the resulting arthrogram.    One to 1.25 ml of the steroid/anesthetic solution was then injected into each of the facet joints noted above.   Additional Comments:  The patient tolerated the procedure well Dressing: Band-Aid    Post-procedure details: Patient was observed during the procedure. Post-procedure instructions were reviewed.  Patient left the clinic in stable condition.    Clinical History: MRI LUMBAR SPINE WITHOUT CONTRAST  TECHNIQUE: Multiplanar,  multisequence MR imaging of the lumbar spine was performed. No intravenous contrast was administered.  COMPARISON:  Radiography 04/05/2017  FINDINGS: Segmentation:  5 lumbar type vertebral bodies.  Alignment: Minimal curvature convex to the right. Straightening of the normal lumbar lordosis.  Vertebrae:   No fracture or primary bone lesion.  Conus medullaris and cauda equina: Conus extends to the L1 level. Conus and cauda equina appear normal.  Paraspinal and other soft tissues: Negative  Disc levels:  T12-L1: Normal.  L1-2: Shallow broad-based disc protrusion indents the ventral thecal sac but does not cause neural compression.  L2-3: Mild bulging of the disc.  No stenosis.  L3-4: Normal appearance of the disc.  Mild facet osteoarthritis.  L4-5: Shallow protrusion of the disc indents the thecal sac mildly and causes mild narrowing of the lateral recesses and foramina. More encroachment upon the left L4 nerve than the right. Bilateral facet arthritis with edema which could contribute to back pain or referred facet syndrome pain.  L5-S1: Disc bulge more prominent towards the left, contacting the left S1 nerve but without evidence of compression or displacement. No significant foraminal narrowing. Mild bilateral facet arthritis.  IMPRESSION: Straightening of the normal lumbar lordosis. 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 Focal findings as below.  L4-5 shallow disc protrusion. Mild stenosis of both lateral recesses and neural foramina. Foraminal encroachment slightly more pronounced on the left. Either L4 nerve root could be affected, more likely the left. Facet arthritis at this level could be associated with pain.  L5-S1 disc bulge more prominent towards the left, contacting the left S1 nerve but not grossly compressing it. This could cause left S1 nerve irritation.   Electronically Signed   By: Carl FusiMark  Shogry M.D.   On: 04/24/2017 07:13   He reports that he has been smoking cigarettes.  He has been smoking about 0.25 packs per day. He has never used smokeless tobacco. No results for input(s): HGBA1C, LABURIC in the last 8760 hours.  Objective:  VS:  HT:    WT:   BMI:     BP:122/83  HR:76bpm   TEMP:98.6 F (37 C)(Oral)  RESP:97 % Physical Exam  Musculoskeletal:  They stand and ambulate with a forward flexed lumbar spine.  There is low back pain with extension of the lumbar spine.  There is good distal strength.    Ortho Exam Imaging: No results found.  Past Medical/Family/Surgical/Social History: Medications & Allergies reviewed per EMR, new medications updated. There are no active problems to display for this patient.  Past Medical History:  Diagnosis Date  . Arthritis   . Cellulitis    History reviewed. No pertinent family history. History reviewed. No pertinent surgical history. Social History   Occupational History  . Not on file  Tobacco Use  . Smoking status: Current Every Day Smoker    Packs/day: 0.25    Types: Cigarettes  . Smokeless tobacco: Never Used  Substance and Sexual Activity  . Alcohol use: Yes    Comment: social   . Drug use: No  . Sexual activity: Not on file

## 2017-06-16 ENCOUNTER — Telehealth (INDEPENDENT_AMBULATORY_CARE_PROVIDER_SITE_OTHER): Payer: Self-pay | Admitting: Physical Medicine and Rehabilitation

## 2017-06-16 NOTE — Telephone Encounter (Signed)
Bilateral L4 TF 4/8. Please advise.

## 2017-06-16 NOTE — Telephone Encounter (Signed)
We did bilateral L4-5 facet before, if not much relief then L4-5 interlam epidural or f/up with Dr. Roda ShuttersXu for hip

## 2017-06-16 NOTE — Telephone Encounter (Signed)
Scheduled for 06/27/17 at 1400.

## 2017-06-16 NOTE — Telephone Encounter (Signed)
Injection right side   Pt called and stated he is having Bi pain in legs.Pt feels like his pain is coming from Right hip. Pt is also having back pain please call pt to discuss pt care.

## 2017-06-27 ENCOUNTER — Ambulatory Visit (INDEPENDENT_AMBULATORY_CARE_PROVIDER_SITE_OTHER): Payer: Self-pay | Admitting: Orthopaedic Surgery

## 2017-06-27 ENCOUNTER — Encounter (INDEPENDENT_AMBULATORY_CARE_PROVIDER_SITE_OTHER): Payer: Self-pay | Admitting: Physical Medicine and Rehabilitation

## 2017-06-29 ENCOUNTER — Ambulatory Visit (INDEPENDENT_AMBULATORY_CARE_PROVIDER_SITE_OTHER): Payer: Self-pay | Admitting: Physical Medicine and Rehabilitation

## 2017-06-29 ENCOUNTER — Ambulatory Visit (INDEPENDENT_AMBULATORY_CARE_PROVIDER_SITE_OTHER): Payer: Self-pay

## 2017-06-29 ENCOUNTER — Encounter (INDEPENDENT_AMBULATORY_CARE_PROVIDER_SITE_OTHER): Payer: Self-pay | Admitting: Physical Medicine and Rehabilitation

## 2017-06-29 VITALS — BP 128/86 | HR 85

## 2017-06-29 DIAGNOSIS — M5416 Radiculopathy, lumbar region: Secondary | ICD-10-CM

## 2017-06-29 MED ORDER — METHOCARBAMOL 500 MG PO TABS: 500.0000 mg | ORAL_TABLET | Freq: Three times a day (TID) | ORAL | 0 refills | Status: AC | PRN

## 2017-06-29 MED ORDER — METHYLPREDNISOLONE ACETATE 80 MG/ML IJ SUSP
80.0000 mg | Freq: Once | INTRAMUSCULAR | Status: AC
  Administered 2017-06-29: 80 mg

## 2017-06-29 NOTE — Progress Notes (Signed)
.  Numeric Pain Rating Scale and Functional Assessment Average Pain 9   In the last MONTH (on 0-10 scale) has pain interfered with the following?  1. General activity like being  able to carry out your everyday physical activities such as walking, climbing stairs, carrying groceries, or moving a chair?  Rating(8)   +Driver, -BT, -Dye Allergies.  

## 2017-06-29 NOTE — Patient Instructions (Signed)

## 2017-07-14 ENCOUNTER — Ambulatory Visit (INDEPENDENT_AMBULATORY_CARE_PROVIDER_SITE_OTHER): Payer: Self-pay | Admitting: Orthopaedic Surgery

## 2017-07-14 NOTE — Procedures (Signed)
Lumbar Epidural Steroid Injection - Interlaminar Approach with Fluoroscopic Guidance  Patient: Carl Sharp      Date of Birth: 1965-10-25 MRN: [id number] PCP: Patient, No Pcp Per      Visit Date: 06/29/2017   Universal Protocol:     Consent Given By: the patient  Position: PRONE  Additional Comments: Vital signs were monitored before and after the procedure. Patient was prepped and draped in the usual sterile fashion. The correct patient, procedure, and site was verified.   Injection Procedure Details:  Procedure Site One Meds Administered:  Meds ordered this encounter  Medications  . methylPREDNISolone acetate (DEPO-MEDROL) injection 80 mg  . methocarbamol (ROBAXIN) 500 MG tablet    Sig: Take 1 tablet (500 mg total) by mouth every 8 (eight) hours as needed for muscle spasms.    Dispense:  60 tablet    Refill:  0     Laterality: Right  Location/Site:  L4-L5  Needle size: 20 G  Needle type: Tuohy  Needle Placement: Paramedian epidural  Findings:   -Comments: Excellent flow of contrast into the epidural space.  Procedure Details: Using a paramedian approach from the side mentioned above, the region overlying the inferior lamina was localized under fluoroscopic visualization and the soft tissues overlying this structure were infiltrated with 4 ml. of 1% Lidocaine without Epinephrine. The Tuohy needle was inserted into the epidural space using a paramedian approach.   The epidural space was localized using loss of resistance along with lateral and bi-planar fluoroscopic views.  After negative aspirate for air, blood, and CSF, a 2 ml. volume of Isovue-250 was injected into the epidural space and the flow of contrast was observed. Radiographs were obtained for documentation purposes.    The injectate was administered into the level noted above.   Additional Comments:  The patient tolerated the procedure well Dressing: Band-Aid    Post-procedure details: Patient  was observed during the procedure. Post-procedure instructions were reviewed.  Patient left the clinic in stable condition.

## 2017-07-14 NOTE — Progress Notes (Signed)
Carl Sharp - 52 y.o. male MRN [id number]  Date of birth: 1965-08-26  Office Visit Note: Visit Date: 06/29/2017 PCP: Patient, No Pcp Per Referred by: No ref. provider found  Subjective: Chief Complaint  Patient presents with  . Lower Back - Pain  . Right Hip - Pain  . Right Leg - Pain   HPI: Carl Sharp is a 52 year old gentleman that we recently completed bilateral facet joint block at L4-5.  Initially he said it did not help at all but then he also comments it did not last long we had a brief talk about this as an injection being diagnostic versus therapeutic for a long-term.  He is getting pain more of the right low back.  He does have lateral recess narrowing but mainly facet arthropathy at L4-5 with some edema.  We will go ahead and complete epidural injection today at the L4-5 level.  Depending on relief with the epidural my only comment at that point would be perhaps diagnostic medial branch block with a look at radiofrequency ablation but is coming hard to get him to understand the reasoning behind that although it would be a decent treatment.  We also refilled his Robaxin.  He will continue to follow-up with Dr. Roda Shutters.     ROS Otherwise per HPI.  Assessment & Plan: Visit Diagnoses:  1. Lumbar radiculopathy     Plan: No additional findings.   Meds & Orders:  Meds ordered this encounter  Medications  . methylPREDNISolone acetate (DEPO-MEDROL) injection 80 mg  . methocarbamol (ROBAXIN) 500 MG tablet    Sig: Take 1 tablet (500 mg total) by mouth every 8 (eight) hours as needed for muscle spasms.    Dispense:  60 tablet    Refill:  0    Orders Placed This Encounter  Procedures  . XR C-ARM NO REPORT  . Epidural Steroid injection    Follow-up: Return for Dr. Roda Shutters.   Procedures: No procedures performed  Lumbar Epidural Steroid Injection - Interlaminar Approach with Fluoroscopic Guidance  Patient: Carl Sharp      Date of Birth: [birth year] MRN: [id number] PCP:  Patient, No Pcp Per      Visit Date: 06/29/2017   Universal Protocol:     Consent Given By: the patient  Position: PRONE  Additional Comments: Vital signs were monitored before and after the procedure. Patient was prepped and draped in the usual sterile fashion. The correct patient, procedure, and site was verified.   Injection Procedure Details:  Procedure Site One Meds Administered:  Meds ordered this encounter  Medications  . methylPREDNISolone acetate (DEPO-MEDROL) injection 80 mg  . methocarbamol (ROBAXIN) 500 MG tablet    Sig: Take 1 tablet (500 mg total) by mouth every 8 (eight) hours as needed for muscle spasms.    Dispense:  60 tablet    Refill:  0     Laterality: Right  Location/Site:  L4-L5  Needle size: 20 G  Needle type: Tuohy  Needle Placement: Paramedian epidural  Findings:   -Comments: Excellent flow of contrast into the epidural space.  Procedure Details: Using a paramedian approach from the side mentioned above, the region overlying the inferior lamina was localized under fluoroscopic visualization and the soft tissues overlying this structure were infiltrated with 4 ml. of 1% Lidocaine without Epinephrine. The Tuohy needle was inserted into the epidural space using a paramedian approach.   The epidural space was localized using loss of resistance along with lateral and bi-planar fluoroscopic views.  After negative aspirate for air, blood, and CSF, a 2 ml. volume of Isovue-250 was injected into the epidural space and the flow of contrast was observed. Radiographs were obtained for documentation purposes.    The injectate was administered into the level noted above.   Additional Comments:  The patient tolerated the procedure well Dressing: Band-Aid    Post-procedure details: Patient was observed during the procedure. Post-procedure instructions were reviewed.  Patient left the clinic in stable condition.   Clinical History: MRI LUMBAR  SPINE WITHOUT CONTRAST  TECHNIQUE: Multiplanar, multisequence MR imaging of the lumbar spine was performed. No intravenous contrast was administered.  COMPARISON:  Radiography 04/05/2017  FINDINGS: Segmentation:  5 lumbar type vertebral bodies.  Alignment: Minimal curvature convex to the right. Straightening of the normal lumbar lordosis.  Vertebrae:  No fracture or primary bone lesion.  Conus medullaris and cauda equina: Conus extends to the L1 level. Conus and cauda equina appear normal.  Paraspinal and other soft tissues: Negative  Disc levels:  T12-L1: Normal.  L1-2: Shallow broad-based disc protrusion indents the ventral thecal sac but does not cause neural compression.  L2-3: Mild bulging of the disc.  No stenosis.  L3-4: Normal appearance of the disc.  Mild facet osteoarthritis.  L4-5: Shallow protrusion of the disc indents the thecal sac mildly and causes mild narrowing of the lateral recesses and foramina. More encroachment upon the left L4 nerve than the right. Bilateral facet arthritis with edema which could contribute to back pain or referred facet syndrome pain.  L5-S1: Disc bulge more prominent towards the left, contacting the left S1 nerve but without evidence of compression or displacement. No significant foraminal narrowing. Mild bilateral facet arthritis.  IMPRESSION: Straightening of the normal lumbar lordosis. 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 Focal findings as below.  L4-5 shallow disc protrusion. Mild stenosis of both lateral recesses and neural foramina. Foraminal encroachment slightly more pronounced on the left. Either L4 nerve root could be affected, more likely the left. Facet arthritis at this level could be associated with pain.  L5-S1 disc bulge more prominent towards the left, contacting the left S1 nerve but not grossly compressing it. This could cause  left S1 nerve irritation.   Electronically Signed   By: Carl Sharp M.D.   On: 04/24/2017 07:13   He reports that he has been smoking cigarettes.  He has been smoking about 0.25 packs per day. He has never used smokeless tobacco. No results for input(s): HGBA1C, LABURIC in the last 8760 hours.  Objective:  VS:  HT:    WT:   BMI:     BP:128/86  HR:85bpm  TEMP: ( )  RESP:  Physical Exam  Ortho Exam Imaging: No results found.  Past Medical/Family/Surgical/Social History: Medications & Allergies reviewed per EMR, new medications updated. There are no active problems to display for this patient.  Past Medical History:  Diagnosis Date  . Arthritis   . Cellulitis    History reviewed. No pertinent family history. History reviewed. No pertinent surgical history. Social History   Occupational History  . Not on file  Tobacco Use  . Smoking status: Current Every Day Smoker    Packs/day: 0.25    Types: Cigarettes  . Smokeless tobacco: Never Used  Substance and Sexual Activity  . Alcohol use: Yes    Comment: social   . Drug use: No  . Sexual activity: Not on file

## 2017-07-23 ENCOUNTER — Encounter (HOSPITAL_COMMUNITY): Payer: Self-pay | Admitting: Emergency Medicine

## 2017-07-23 ENCOUNTER — Emergency Department (HOSPITAL_COMMUNITY)
Admission: EM | Admit: 2017-07-23 | Discharge: 2017-07-23 | Disposition: A | Discharge status: Home / Self Care | Payer: Self-pay | Attending: Emergency Medicine | Admitting: Emergency Medicine

## 2017-07-23 DIAGNOSIS — F17210 Nicotine dependence, cigarettes, uncomplicated: Secondary | ICD-10-CM | POA: Insufficient documentation

## 2017-07-23 DIAGNOSIS — G8929 Other chronic pain: Secondary | ICD-10-CM | POA: Insufficient documentation

## 2017-07-23 DIAGNOSIS — Z79899 Other long term (current) drug therapy: Secondary | ICD-10-CM | POA: Insufficient documentation

## 2017-07-23 DIAGNOSIS — M545 Low back pain: Secondary | ICD-10-CM | POA: Insufficient documentation

## 2017-07-23 DIAGNOSIS — M25551 Pain in right hip: Secondary | ICD-10-CM

## 2017-07-23 MED ORDER — PREDNISONE 20 MG PO TABS
60.0000 mg | ORAL_TABLET | Freq: Once | ORAL | Status: AC
  Administered 2017-07-23: 60 mg via ORAL
  Filled 2017-07-23: qty 3

## 2017-07-23 MED ORDER — OXYCODONE-ACETAMINOPHEN 5-325 MG PO TABS
1.0000 | ORAL_TABLET | Freq: Once | ORAL | Status: AC
  Administered 2017-07-23: 1 via ORAL
  Filled 2017-07-23: qty 1

## 2017-07-23 NOTE — ED Triage Notes (Signed)
Pt c/o R hip and low back pain, chronic pain, no new injury, pt states pain is worse at this time. And meds are not helping

## 2017-07-23 NOTE — ED Provider Notes (Signed)
[facility] EMERGENCY DEPARTMENT Provider Note   CSN: [id number] Arrival date & time: 07/23/17  5784     History   Chief Complaint Chief Complaint  Patient presents with  . Hip Pain    HPI Carl Sharp is a 52 y.o. male with PMHx chronic low back and right hip pain, presenting to the ED with complaint of low back and right hip pain. Pain is worse with prolonged sitting or standing and radiates down right leg. He states he has been seeing Dr. Roda Shutters with orthopedics, and receiving injections for pain. Last injection May 1. Reports the PO medications he was prescribed, tramadol and robaxin, have not been providing him enough relief. No recent injuries. He has not discussed need for more pain control with his orthopedic specialist. States he had an appoint schedule for may 16, however is self-pay and was unable to afford that visit. Denies new weakness or numbness, bowel/bladder incontinence, abd pain, fever, or other assoc sx. Per chart review of L-spine MRI 04/23/17,  pt with multilevel DDD, L4-5 shallow disc protrusion with stenosis, L5-S1 disc bulge.  The history is provided by the patient.    Past Medical History:  Diagnosis Date  . Arthritis   . Cellulitis     There are no active problems to display for this patient.   History reviewed. No pertinent surgical history.      Home Medications    Prior to Admission medications   Medication Sig Start Date End Date Taking? Authorizing Provider  methocarbamol (ROBAXIN) 500 MG tablet Take 1 tablet (500 mg total) by mouth every 8 (eight) hours as needed for muscle spasms. 06/29/17   Tyrell Antonio, MD  methylPREDNISolone (MEDROL DOSEPAK) 4 MG TBPK tablet Taper over 6 days. 03/30/17   Khatri, Hina, PA-C  traMADol (ULTRAM) 50 MG tablet Take 1-2 tablets (50-100 mg total) by mouth 3 (three) times Sharp as needed. 05/19/17   Tarry Kos, MD    Family History No family history on file.  Social History Social  History   Tobacco Use  . Smoking status: Current Every Day Smoker    Packs/day: 0.25    Types: Cigarettes  . Smokeless tobacco: Never Used  Substance Use Topics  . Alcohol use: Yes    Comment: social   . Drug use: No     Allergies   Other   Review of Systems Review of Systems  Constitutional: Negative for fever.  Gastrointestinal: Negative for abdominal pain.       No bowel incontinence  Genitourinary: Negative for difficulty urinating.  Musculoskeletal: Positive for arthralgias and back pain.  Neurological: Negative for weakness and numbness.     Physical Exam Updated Vital Signs BP (!) 130/95   Pulse 73   Temp 98.1 F (36.7 C) (Oral)   Resp 18   Ht  (1.854 m)   Wt 98.4 kg (217 lb)   SpO2 100%   BMI 28.63 kg/m   Physical Exam  Constitutional: He appears well-developed and well-nourished. No distress.  HENT:  Head: Normocephalic and atraumatic.  Eyes: Conjunctivae are normal.  Neck: Normal range of motion.  Cardiovascular: Normal rate and regular rhythm.  Pulmonary/Chest: Effort normal and breath sounds normal.  Abdominal: Soft. Bowel sounds are normal. He exhibits no distension. There is no tenderness.  Musculoskeletal:  Some midline l-spine and paraspinal tenderness, no bony step-offs or gross deformities. Moving all extremities.  Neurological:  Motor:  Normal tone. 5/5 in lower extremities bilaterally including  strong and equal dorsiflexion/plantar flexion Sensory: Pinprick and light touch normal in BLE extremities.  Deep Tendon Reflexes: 2+ and symmetric in the b/l patella Gait: normal gait and balance CV: distal pulses palpable throughout    Psychiatric: He has a normal mood and affect. His behavior is normal.  Nursing note and vitals reviewed.    ED Treatments / Results  Labs (all labs ordered are listed, but only abnormal results are displayed) Labs Reviewed - No data to display  EKG None  Radiology No results  found.  Procedures Procedures (including critical care time)  Medications Ordered in ED Medications  predniSONE (DELTASONE) tablet 60 mg (60 mg Oral Given 07/23/17 0807)  oxyCODONE-acetaminophen (PERCOCET/ROXICET) 5-325 MG per tablet 1 tablet (1 tablet Oral Given 07/23/17 0807)     Initial Impression / Assessment and Plan / ED Course  I have reviewed the triage vital signs and the nursing notes.  Pertinent labs & imaging results that were available during my care of the patient were reviewed by me and considered in my medical decision making (see chart for details).     Pt with chronic low back and right hip pain, presenting with persistent pain not relieved with prescribed medications. No neurological deficits and normal neuro exam.  Patient can walk but states is painful.  No loss of bowel or bladder control.  No concern for cauda equina.  No fever, night sweats, weight loss, h/o cancer, IVDU.  Pain treated in the ED. RICE protocol and pain medicine indicated and discussed with patient.  Recommend follow up with his specialist.  Discussed results, findings, treatment and follow up. Patient advised of return precautions. Patient verbalized understanding and agreed with plan.  Final Clinical Impressions(s) / ED Diagnoses   Final diagnoses:  Chronic right-sided low back pain, with sciatica presence unspecified  Chronic right hip pain    ED Discharge Orders    None       Robinson, [place] N, PA-C 07/23/17 [id number]    Nira Conn, MD 07/23/17 [phone]

## 2017-07-23 NOTE — ED Notes (Signed)
Patient able to ambulate independently  

## 2017-07-23 NOTE — Discharge Instructions (Addendum)
Please read instructions below.  You can take 600 mg of ibuprofen every 6 hours as needed for pain, in addition to your prescribed medications. Apply ice to your back for 20 minutes at a time.  You can also apply heat if this provides more relief.   Continue taking prescribed medications as needed for pain. Follow-up with your back specialist. Return to ER if new numbness or tingling in your arms or legs, inability to urinate, inability to hold your bowels, or weakness in your extremities.

## 2018-04-23 IMAGING — MR MR LUMBAR SPINE W/O CM
4 of 5 series · 20 of 48 positions shown · non-contrast
Comparison: Radiography 04/05/2017

CLINICAL DATA: Right-sided back pain radiating to the right hip and
leg with numbness. Symptoms of 2 years duration.

EXAM:
MRI LUMBAR SPINE WITHOUT CONTRAST
TECHNIQUE: Multiplanar, multisequence MR imaging of the lumbar spine was
performed. No intravenous contrast was administered.

[Series 6: T2 · sagittal · 4.0mm · 0.73mm/px · 6 of 15 slices shown (1 of 2)]
[im 1/15]
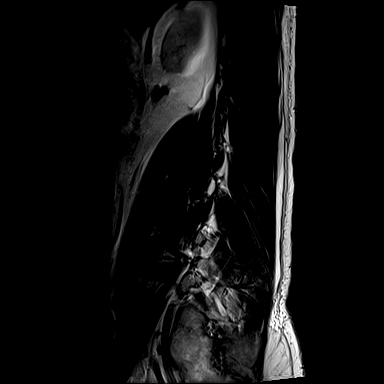
[im 3/15]
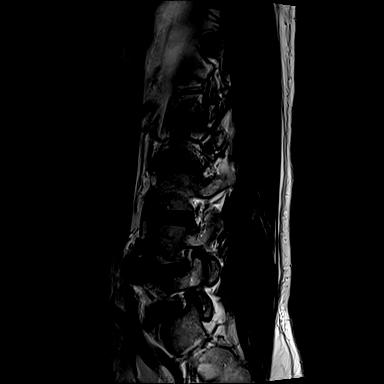
[im 6/15]
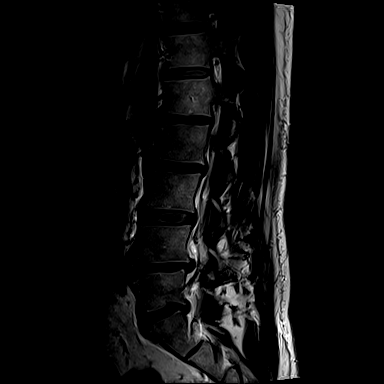
[im 9/15]
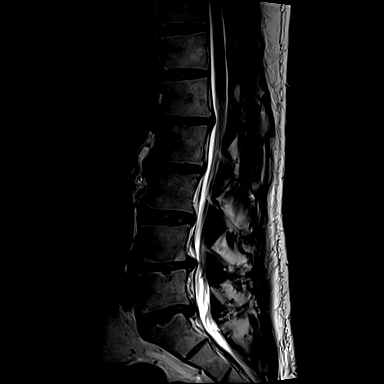
[im 12/15]
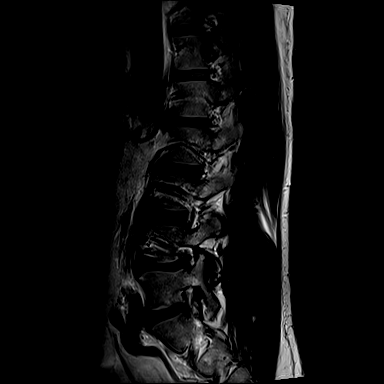
[im 15/15]
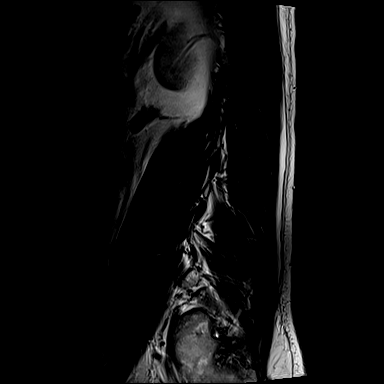

[Series 7: T1 · sagittal · 4.0mm · 0.73mm/px · 3 of 15 slices shown (1 of 2)]
[im 3/15]
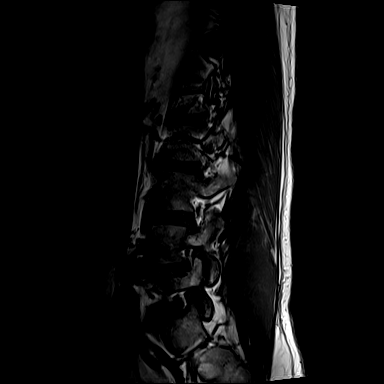
[im 9/15]
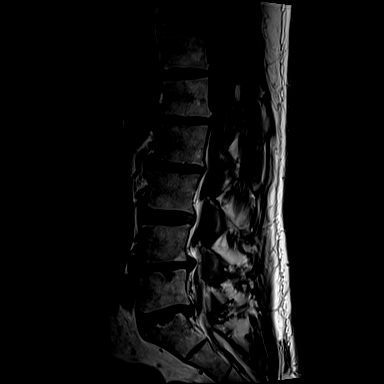
[im 15/15]
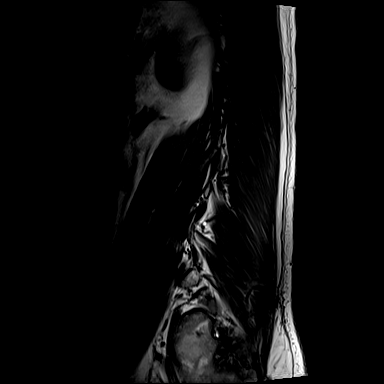

[Series 11: T2 · axial · 4.0mm · 0.28mm/px · z∈[-82,+92]mm · 8 of 41 slices shown (2 of 2)]
[im 1/41]
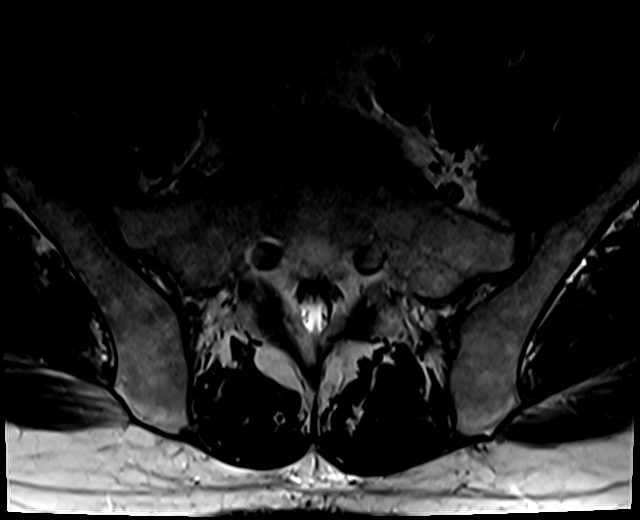
[im 6/41]
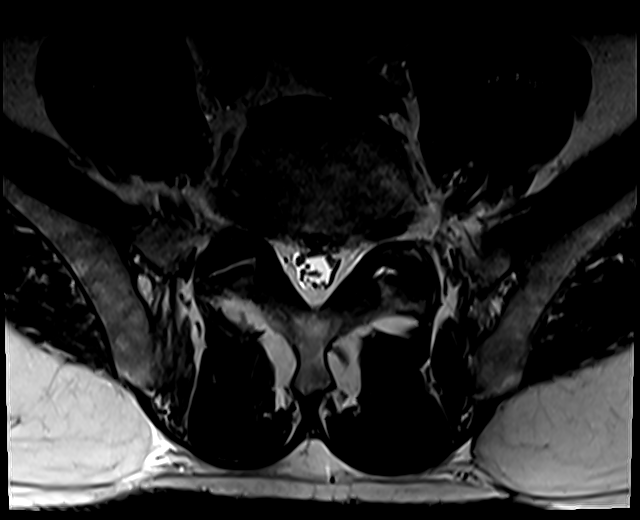
[im 12/41]
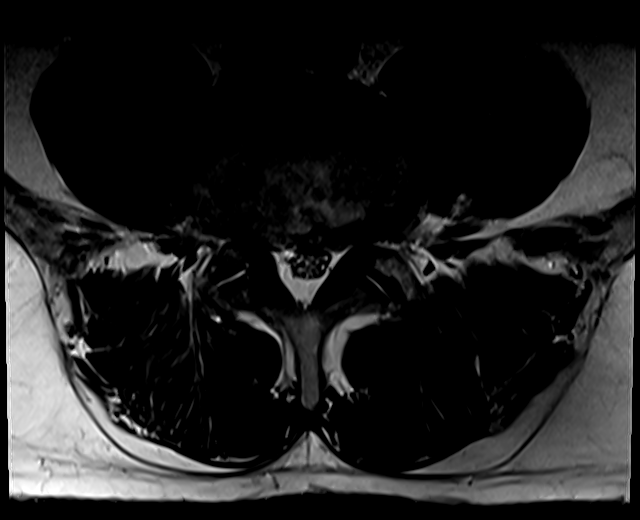
[im 18/41]
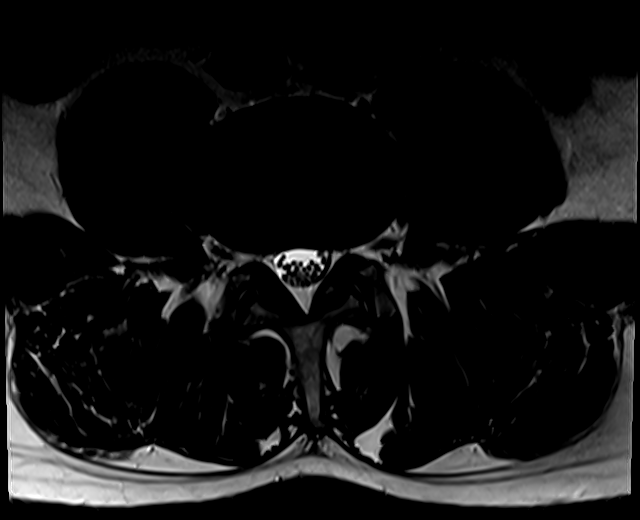
[im 21/41]
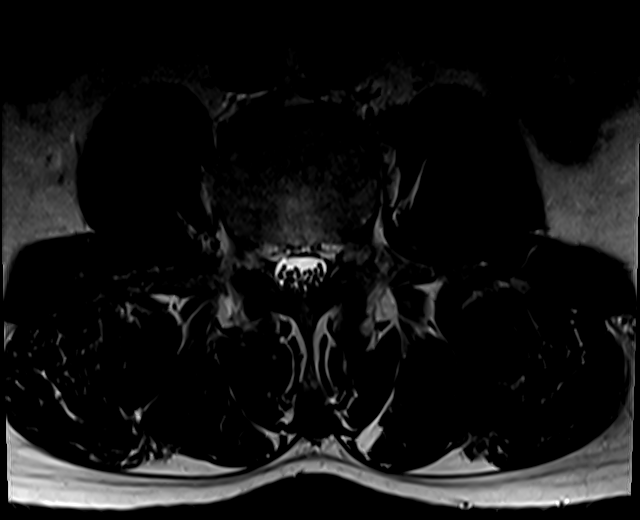
[im 23/41]
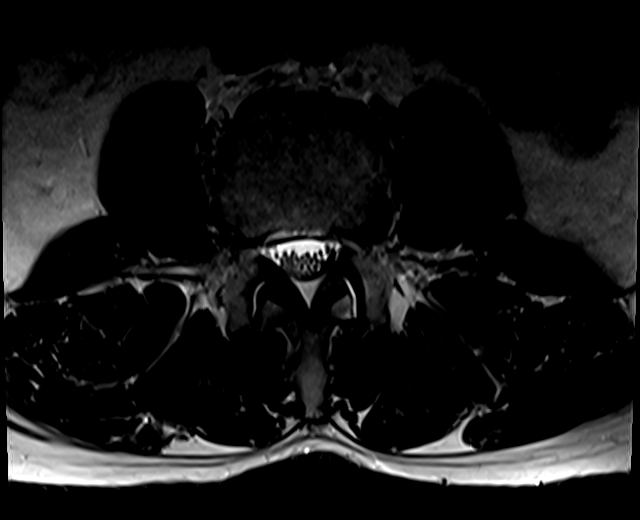
[im 29/41]
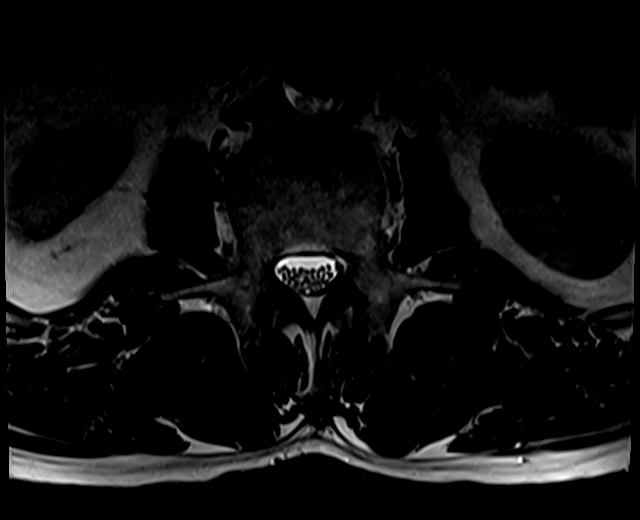
[im 35/41]
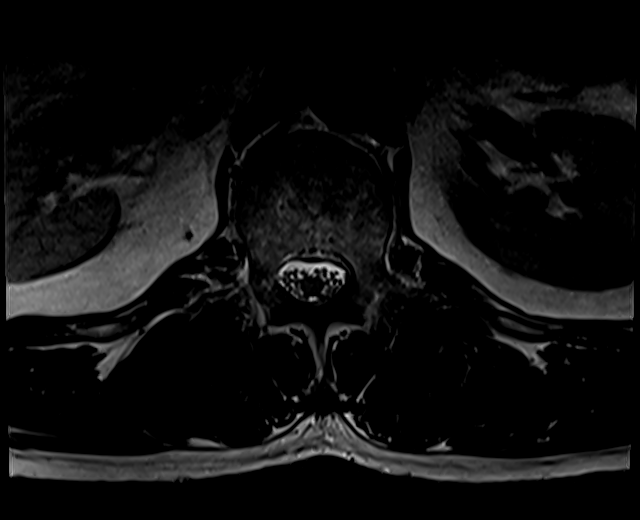

[Series 100: T1 · axial · 4.0mm · 0.28mm/px · z∈[-57,+92]mm · 3 of 41 slices shown (2 of 2)]
[im 6/41]
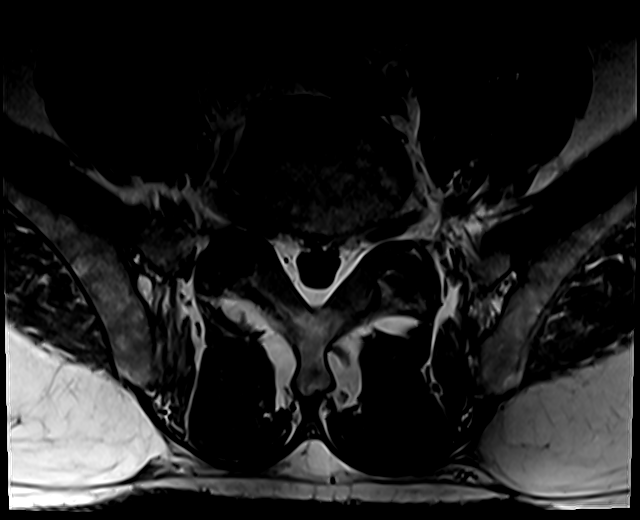
[im 21/41]
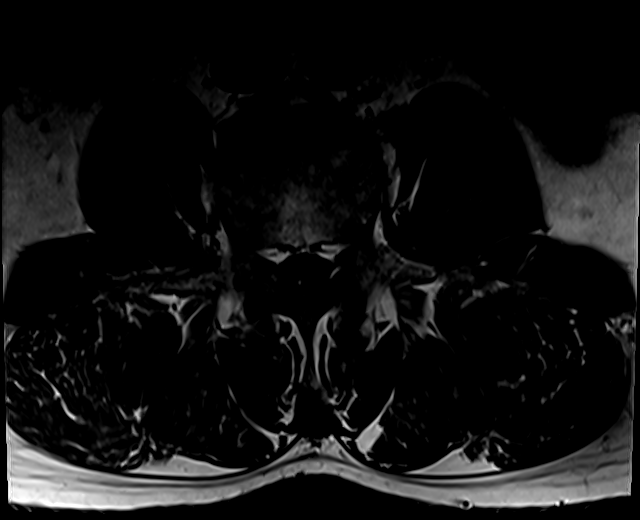
[im 35/41]
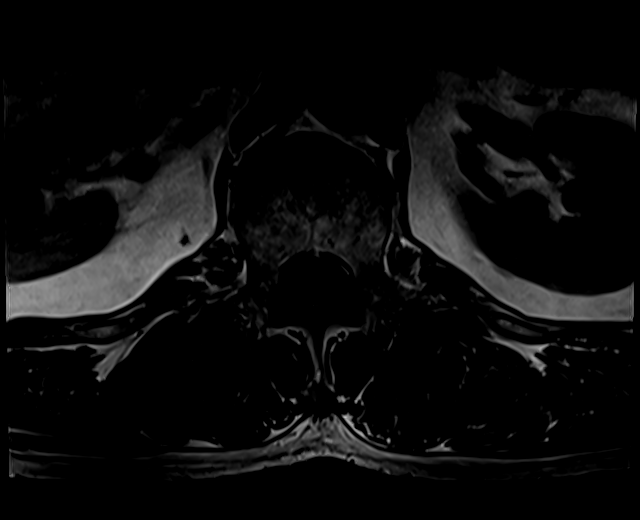

[20 of 48 positions shown; findings below may reference images not displayed]

FINDINGS: Segmentation:  5 lumbar type vertebral bodies.

Alignment: Minimal curvature convex to the right. Straightening of
the normal lumbar lordosis.

Vertebrae:  No fracture or primary bone lesion.

Conus medullaris and cauda equina: Conus extends to the L1 level.
Conus and cauda equina appear normal.

Paraspinal and other soft tissues: Negative

Disc levels:

T12-L1: Normal.

L1-2: Shallow broad-based disc protrusion indents the ventral thecal
sac but does not cause neural compression.

L2-3: Mild bulging of the disc.  No stenosis.

L3-4: Normal appearance of the disc.  Mild facet osteoarthritis.

L4-5: Shallow protrusion of the disc indents the thecal sac mildly
and causes mild narrowing of the lateral recesses and foramina. More
encroachment upon the left L4 nerve than the right. Bilateral facet
arthritis with edema which could contribute to back pain or referred
facet syndrome pain.

L5-S1: Disc bulge more prominent towards the left, contacting the
left S1 nerve but without evidence of compression or displacement.
No significant foraminal narrowing. Mild bilateral facet arthritis.
IMPRESSION: Straightening of the normal lumbar lordosis. Multilevel degenerative
disc disease and facet disease which could contribute to back pain.
Focal findings as below.

L4-5 shallow disc protrusion. Mild stenosis of both lateral recesses
and neural foramina. Foraminal encroachment slightly more pronounced
on the left. Either L4 nerve root could be affected, more likely the
left. Facet arthritis at this level could be associated with pain.

L5-S1 disc bulge more prominent towards the left, contacting the
left S1 nerve but not grossly compressing it. This could cause left
S1 nerve irritation.

## 2018-11-15 ENCOUNTER — Ambulatory Visit (INDEPENDENT_AMBULATORY_CARE_PROVIDER_SITE_OTHER): Payer: Medicare Other | Admitting: Emergency Medicine

## 2018-11-15 ENCOUNTER — Encounter: Payer: Self-pay | Admitting: Emergency Medicine

## 2018-11-15 ENCOUNTER — Telehealth: Payer: Self-pay

## 2018-11-15 ENCOUNTER — Other Ambulatory Visit: Payer: Self-pay

## 2018-11-15 VITALS — BP 113/77 | HR 75 | Temp 98.4°F | Resp 16 | Ht 73.0 in | Wt 226.2 lb

## 2018-11-15 DIAGNOSIS — G894 Chronic pain syndrome: Secondary | ICD-10-CM

## 2018-11-15 NOTE — Patient Instructions (Addendum)
   If you have lab work done today you will be contacted with your lab results within the next 2 weeks.  If you have not heard from us then please contact us. The fastest way to get your results is to register for My Chart.   IF you received an x-ray today, you will receive an invoice from [facility]. Please contact [facility] at [phone] with questions or concerns regarding your invoice.   IF you received labwork today, you will receive an invoice from [facility]. Please contact [facility] at [phone] with questions or concerns regarding your invoice.   Our billing staff will not be able to assist you with questions regarding bills from these companies.  You will be contacted with the lab results as soon as they are available. The fastest way to get your results is to activate your My Chart account. Instructions are located on the last page of this paperwork. If you have not heard from us regarding the results in 2 weeks, please contact this office.     Chronic Pain, Adult Chronic pain is a type of pain that lasts or keeps coming back (recurs) for at least six months. You may have chronic headaches, abdominal pain, or body pain. Chronic pain may be related to an illness, such as fibromyalgia or complex regional pain syndrome. Sometimes the cause of chronic pain is not known. Chronic pain can make it hard for you to do daily activities. If not treated, chronic pain can lead to other health problems, including anxiety and depression. Treatment depends on the cause and severity of your pain. You may need to work with a pain specialist to come up with a treatment plan. The plan may include medicine, counseling, and physical therapy. Many people benefit from a combination of two or more types of treatment to control their pain. Follow these instructions at home: Lifestyle  Consider keeping a pain diary to share with your health care providers.  Consider talking with a  mental health care provider (psychologist) about how to cope with chronic pain.  Consider joining a chronic pain support group.  Try to control or lower your stress levels. Talk to your health care provider about strategies to do this. General instructions   Take over-the-counter and prescription medicines only as told by your health care provider.  Follow your treatment plan as told by your health care provider. This may include: ? Gentle, regular exercise. ? Eating a healthy diet that includes foods such as vegetables, fruits, fish, and lean meats. ? Cognitive or behavioral therapy. ? Working with a physical therapist. ? Meditation or yoga. ? Acupuncture or massage therapy. ? Aroma, color, light, or sound therapy. ? Local electrical stimulation. ? Shots (injections) of numbing or pain-relieving medicines into the spine or the area of pain.  Check your pain level as told by your health care provider. Ask your health care provider if you should use a pain scale.  Learn as much as you can about how to manage your chronic pain. Ask your health care provider if an intensive pain rehabilitation program or a chronic pain specialist would be helpful.  Keep all follow-up visits as told by your health care provider. This is important. Contact a health care provider if:  Your pain gets worse.  You have new pain.  You have trouble sleeping.  You have trouble doing your normal activities.  Your pain is not controlled with treatment.  Your have side effects from pain medicine.    You feel weak. Get help right away if:  You lose feeling or have numbness in your body.  You lose control of bowel or bladder function.  Your pain suddenly gets much worse.  You develop shaking or chills.  You develop confusion.  You develop chest pain.  You have trouble breathing or shortness of breath.  You pass out.  You have thoughts about hurting yourself or others. This information is not  intended to replace advice given to you by your health care provider. Make sure you discuss any questions you have with your health care provider. Document Released: 11/07/2001 Document Revised: 01/28/2017 Document Reviewed: 08/05/2015 Elsevier Patient Education  2020 [employer]  

## 2018-11-15 NOTE — Telephone Encounter (Signed)
Pt has had referral entered for pain management.  After OV, he stated that he would like to go to [facility], if possible.   Thank you!

## 2018-11-15 NOTE — Progress Notes (Signed)
Carl Sharp 53 y.o.   Chief Complaint  Patient presents with  . Establish Care  . Medication Refill    gabapentin, ibuprofen and oxycodone    HISTORY OF PRESENT ILLNESS: This is a 53 y.o. male with history of chronic pain looking to establish care for pain management and medication refills. Recently moved from [place].  Requesting oxycodone, oxymorphone, gabapentin, prednisone, and high-dose ibuprofen. No other significant past medical history.  States he has chronic arthritis.  Has no established orthopedist in the area.  HPI   Prior to Admission medications   Medication Sig Start Date End Date Taking? Authorizing Provider  gabapentin (NEURONTIN) 400 MG capsule Take 400 mg by mouth 3 (three) times daily.   Yes [provider]  ibuprofen (ADVIL) 800 MG tablet Take 800 mg by mouth every 8 (eight) hours as needed.   Yes [provider]  oxyCODONE 30 MG 12 hr tablet Take by mouth.   Yes [provider]  oxymorphone (OPANA ER) 40 MG 12 hr tablet Take 40 mg by mouth every 12 (twelve) hours.   Yes [provider]  traMADol (ULTRAM) 50 MG tablet Take 1-2 tablets (50-100 mg total) by mouth 3 (three) times daily as needed. 05/19/17  Yes Tarry Kos, MD  methocarbamol (ROBAXIN) 500 MG tablet Take 1 tablet (500 mg total) by mouth every 8 (eight) hours as needed for muscle spasms. Patient not taking: Reported on 11/15/2018 06/29/17   Tyrell Antonio, MD  methylPREDNISolone (MEDROL DOSEPAK) 4 MG TBPK tablet Taper over 6 days. Patient not taking: Reported on 11/15/2018 03/30/17   Dietrich Pates, PA-C    Allergies  Allergen Reactions  . Other     Pt states he once took a cough medicine that made his mouth numb.    There are no active problems to display for this patient.   Past Medical History:  Diagnosis Date  . Arthritis   . Cellulitis     History reviewed. No pertinent surgical history.  Social History   Socioeconomic History  . Marital  status: Divorced    Spouse name: Not on file  . Number of children: Not on file  . Years of education: Not on file  . Highest education level: Not on file  Occupational History  . Not on file  Social Needs  . Financial resource strain: Not on file  . Food insecurity    Worry: Not on file    Inability: Not on file  . Transportation needs    Medical: Not on file    Non-medical: Not on file  Tobacco Use  . Smoking status: Current Every Day Smoker    Packs/day: 0.25    Types: Cigarettes  . Smokeless tobacco: Never Used  Substance and Sexual Activity  . Alcohol use: Yes    Comment: social   . Drug use: No  . Sexual activity: Not on file  Lifestyle  . Physical activity    Days per week: Not on file    Minutes per session: Not on file  . Stress: Not on file  Relationships  . Social Musician on phone: Not on file    Gets together: Not on file    Attends religious service: Not on file    Active member of club or organization: Not on file    Attends meetings of clubs or organizations: Not on file    Relationship status: Not on file  . Intimate partner violence  Fear of current or ex partner: Not on file    Emotionally abused: Not on file    Physically abused: Not on file    Forced sexual activity: Not on file  Other Topics Concern  . Not on file  Social History Narrative  . Not on file    History reviewed. No pertinent family history.   Review of Systems  Constitutional: Negative.  Negative for chills and fever.  HENT: Negative.  Negative for congestion and sore throat.   Eyes: Negative.   Respiratory: Negative.  Negative for cough and shortness of breath.   Cardiovascular: Negative.  Negative for chest pain and palpitations.  Gastrointestinal: Negative.  Negative for abdominal pain, nausea and vomiting.  Genitourinary: Negative.   Musculoskeletal: Positive for back pain and joint pain. Negative for myalgias.  Skin: Negative.   Neurological: Negative  for dizziness and headaches.  All other systems reviewed and are negative.  Vitals:   11/15/18 1525  BP: 113/77  Pulse: 75  Resp: 16  Temp: 98.4 F (36.9 C)  SpO2: 97%     Physical Exam Vitals signs reviewed.  Constitutional:      Appearance: Normal appearance.  HENT:     Head: Normocephalic.  Eyes:     Extraocular Movements: Extraocular movements intact.  Neck:     Musculoskeletal: Normal range of motion.  Cardiovascular:     Rate and Rhythm: Normal rate.  Pulmonary:     Effort: Pulmonary effort is normal.  Neurological:     General: No focal deficit present.     Mental Status: He is alert and oriented to person, place, and time.  Psychiatric:        Mood and Affect: Mood normal.        Behavior: Behavior normal.      ASSESSMENT & PLAN: Carl Sharp was seen today for establish care and medication refill.  Diagnoses and all orders for this visit:  Chronic pain syndrome -     Ambulatory referral to Pain Clinic   Chronic pain patient that needs chronic special care by pain management specialist. I do not feel comfortable handling chronic pain patients and made this clear to Carl Sharp. Therefore I will not accept to be his PCP. He understands.    Patient Instructions       If you have lab work done today you will be contacted with your lab results within the next 2 weeks.  If you have not heard from [place] then please contact us. The fastest way to get your results is to register for My Chart.   IF you received an x-ray today, you will receive an invoice from [facility]. Please contact [facility] at [phone] with questions or concerns regarding your invoice.   IF you received labwork today, you will receive an invoice from [place]. Please contact [facility] at [phone] with questions or concerns regarding your invoice.   Our billing staff will not be able to assist you with questions regarding bills from these companies.  You will be  contacted with the lab results as soon as they are available. The fastest way to get your results is to activate your My Chart account. Instructions are located on the last page of this paperwork. If you have not heard from [place] regarding the results in 2 weeks, please contact this office.     Chronic Pain, Adult Chronic pain is a type of pain that lasts or keeps coming back (recurs) for at least six months. You  may have chronic headaches, abdominal pain, or body pain. Chronic pain may be related to an illness, such as fibromyalgia or complex regional pain syndrome. Sometimes the cause of chronic pain is not known. Chronic pain can make it hard for you to do daily activities. If not treated, chronic pain can lead to other health problems, including anxiety and depression. Treatment depends on the cause and severity of your pain. You may need to work with a pain specialist to come up with a treatment plan. The plan may include medicine, counseling, and physical therapy. Many people benefit from a combination of two or more types of treatment to control their pain. Follow these instructions at home: Lifestyle  Consider keeping a pain diary to share with your health care providers.  Consider talking with a mental health care provider (psychologist) about how to cope with chronic pain.  Consider joining a chronic pain support group.  Try to control or lower your stress levels. Talk to your health care provider about strategies to do this. General instructions   Take over-the-counter and prescription medicines only as told by your health care provider.  Follow your treatment plan as told by your health care provider. This may include: ? Gentle, regular exercise. ? Eating a healthy diet that includes foods such as vegetables, fruits, fish, and lean meats. ? Cognitive or behavioral therapy. ? Working with a Adult nursephysical therapist. ? Meditation or yoga. ? Acupuncture or massage therapy. ? Aroma,  color, light, or sound therapy. ? Local electrical stimulation. ? Shots (injections) of numbing or pain-relieving medicines into the spine or the area of pain.  Check your pain level as told by your health care provider. Ask your health care provider if you should use a pain scale.  Learn as much as you can about how to manage your chronic pain. Ask your health care provider if an intensive pain rehabilitation program or a chronic pain specialist would be helpful.  Keep all follow-up visits as told by your health care provider. This is important. Contact a health care provider if:  Your pain gets worse.  You have new pain.  You have trouble sleeping.  You have trouble doing your normal activities.  Your pain is not controlled with treatment.  Your have side effects from pain medicine.  You feel weak. Get help right away if:  You lose feeling or have numbness in your body.  You lose control of bowel or bladder function.  Your pain suddenly gets much worse.  You develop shaking or chills.  You develop confusion.  You develop chest pain.  You have trouble breathing or shortness of breath.  You pass out.  You have thoughts about hurting yourself or others. This information is not intended to replace advice given to you by your health care provider. Make sure you discuss any questions you have with your health care provider. Document Released: 11/07/2001 Document Revised: 01/28/2017 Document Reviewed: 08/05/2015 Elsevier Patient Education  2020 Elsevier Inc.       Edwina BarthMiguel Sirinity Outland, MD [facility]

## 2018-11-22 ENCOUNTER — Telehealth: Payer: Self-pay | Admitting: Emergency Medicine

## 2018-11-22 NOTE — Telephone Encounter (Signed)
Error

## 2018-11-23 ENCOUNTER — Other Ambulatory Visit: Payer: Self-pay

## 2018-11-23 ENCOUNTER — Encounter: Payer: Self-pay | Admitting: Orthopaedic Surgery

## 2018-11-23 ENCOUNTER — Ambulatory Visit (INDEPENDENT_AMBULATORY_CARE_PROVIDER_SITE_OTHER): Payer: Medicare Other | Admitting: Orthopaedic Surgery

## 2018-11-23 DIAGNOSIS — M545 Low back pain, unspecified: Secondary | ICD-10-CM

## 2018-11-24 DIAGNOSIS — M545 Low back pain, unspecified: Secondary | ICD-10-CM | POA: Insufficient documentation

## 2018-11-24 NOTE — Progress Notes (Signed)
Office Visit Note   Patient: Carl Sharp           Date of Birth: 1965/06/18           MRN: [id number] Visit Date: 11/23/2018              Requested by: Georgina Quint, MD [address] [place],  [place] [address] PCP: Georgina Quint, MD   Assessment & Plan: Visit Diagnoses:  1. Low back pain without sciatica, unspecified back pain laterality, unspecified chronicity     Plan: Patient has some disc degeneration L4-5 L5-S1.  We reviewed MRI scan and MRI report.  Walking program core strengthening discussed.  MRI did not show areas that required surgical intervention at this time.  Follow-Up Instructions: No follow-ups on file.   Orders:  No orders of the defined types were placed in this encounter.  No orders of the defined types were placed in this encounter.     Procedures: No procedures performed   Clinical Data: No additional findings.   Subjective: Chief Complaint  Patient presents with  . Lower Back - Pain    HPI 53 year old male self-referred for evaluation of left back pain hip pain.  He states he had seen a doctor in [place] and also had an injection in [place] and turns out it was at our office he saw Dr.Xu and [facility] for an injection.  Patient been on some oxycodone.  In the past he was in a pain clinic.  He states he has more pain in his back in the morning it in his back radiates into his hip down his right leg sometimes into his right groin.  No associated bowel or bladder symptoms.  He has been on prednisone Dosepak in the past.  PDMP review shows only 1 prescription for Percocet 5/325 number 28 tablets however Opana 40 mg every 12 hour prescription noted started on 11/15/2018.  Patient is not working states he is on disability.  Hip x-rays 2019 normal for his age without significant arthritis.  Review of Systems 14 point system positive mild disc degeneration cervical spine.  Chronic back pain complaints with shallow disc protrusion mild  narrowing L4-5 and L5-S1 disc bulge more prominent to the left without compression.   Objective: Vital Signs: Ht 6\' 1"  (1.854 m)   Wt 227 lb (103 kg)   BMI 29.95 kg/m   Physical Exam Constitutional:      Appearance: He is well-developed.  HENT:     Head: Normocephalic and atraumatic.  Eyes:     Pupils: Pupils are equal, round, and reactive to light.  Neck:     Thyroid: No thyromegaly.     Trachea: No tracheal deviation.  Cardiovascular:     Rate and Rhythm: Normal rate.  Pulmonary:     Effort: Pulmonary effort is normal.     Breath sounds: No wheezing.  Abdominal:     General: Bowel sounds are normal.     Palpations: Abdomen is soft.  Skin:    General: Skin is warm and dry.     Capillary Refill: Capillary refill takes less than 2 seconds.  Neurological:     Mental Status: He is alert and oriented to person, place, and time.  Psychiatric:        Behavior: Behavior normal.        Thought Content: Thought content normal.        Judgment: Judgment normal.     Ortho Exam anterior tib EHL is  intact.  Negative logroll to the hips.  Knees reach full extension no hip flexion weakness.  Distal pulses are intact.  Specialty Comments:  No specialty comments available.  Imaging: CLINICAL DATA:  Right-sided back pain radiating to the right hip and leg with numbness. Symptoms of 2 years duration.  EXAM: MRI LUMBAR SPINE WITHOUT CONTRAST  TECHNIQUE: Multiplanar, multisequence MR imaging of the lumbar spine was performed. No intravenous contrast was administered.  COMPARISON:  Radiography 04/05/2017  FINDINGS: Segmentation:  5 lumbar type vertebral bodies.  Alignment: Minimal curvature convex to the right. Straightening of the normal lumbar lordosis.  Vertebrae:  No fracture or primary bone lesion.  Conus medullaris and cauda equina: Conus extends to the L1 level. Conus and cauda equina appear normal.  Paraspinal and other soft tissues: Negative  Disc  levels:  T12-L1: Normal.  L1-2: Shallow broad-based disc protrusion indents the ventral thecal sac but does not cause neural compression.  L2-3: Mild bulging of the disc.  No stenosis.  L3-4: Normal appearance of the disc.  Mild facet osteoarthritis.  L4-5: Shallow protrusion of the disc indents the thecal sac mildly and causes mild narrowing of the lateral recesses and foramina. More encroachment upon the left L4 nerve than the right. Bilateral facet arthritis with edema which could contribute to back pain or referred facet syndrome pain.  L5-S1: Disc bulge more prominent towards the left, contacting the left S1 nerve but without evidence of compression or displacement. No significant foraminal narrowing. Mild bilateral facet arthritis.  IMPRESSION: Straightening of the normal lumbar lordosis. 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 Focal findings as below.  L4-5 shallow disc protrusion. Mild stenosis of both lateral recesses and neural foramina. Foraminal encroachment slightly more pronounced on the left. Either L4 nerve root could be affected, more likely the left. Facet arthritis at this level could be associated with pain.  L5-S1 disc bulge more prominent towards the left, contacting the left S1 nerve but not grossly compressing it. This could cause left S1 nerve irritation.   Electronically Signed   By: Nelson Chimes M.D.   On: 04/24/2017 07:13   PMFS History: Patient Active Problem List   Diagnosis Date Noted  . Low back pain 11/24/2018   Past Medical History:  Diagnosis Date  . Arthritis   . Cellulitis   . Neuromuscular disorder ([place])     Family History  Problem Relation Age of Onset  . Stroke Mother     No past surgical history on file. Social History   Occupational History  . Not on file  Tobacco Use  . Smoking status: Current Every Day Smoker    Packs/day: 0.25    Years: 6.00     Pack years: 1.50    Types: Cigarettes  . Smokeless tobacco: Never Used  Substance and Sexual Activity  . Alcohol use: Yes    Alcohol/week: 2.0 standard drinks    Types: 2 Shots of liquor per week    Comment: social   . Drug use: No  . Sexual activity: Not on file

## 2018-12-26 DIAGNOSIS — G8929 Other chronic pain: Secondary | ICD-10-CM | POA: Diagnosis not present

## 2018-12-26 DIAGNOSIS — M7918 Myalgia, other site: Secondary | ICD-10-CM | POA: Diagnosis not present

## 2018-12-26 DIAGNOSIS — Z79899 Other long term (current) drug therapy: Secondary | ICD-10-CM | POA: Diagnosis not present

## 2018-12-26 DIAGNOSIS — M545 Low back pain: Secondary | ICD-10-CM | POA: Diagnosis not present

## 2018-12-27 DIAGNOSIS — M545 Low back pain: Secondary | ICD-10-CM | POA: Diagnosis not present

## 2018-12-27 DIAGNOSIS — M62830 Muscle spasm of back: Secondary | ICD-10-CM | POA: Diagnosis not present

## 2018-12-27 DIAGNOSIS — Z79899 Other long term (current) drug therapy: Secondary | ICD-10-CM | POA: Diagnosis not present

## 2018-12-27 DIAGNOSIS — Z1159 Encounter for screening for other viral diseases: Secondary | ICD-10-CM | POA: Diagnosis not present

## 2018-12-27 DIAGNOSIS — G8929 Other chronic pain: Secondary | ICD-10-CM | POA: Diagnosis not present

## 2018-12-27 DIAGNOSIS — Z131 Encounter for screening for diabetes mellitus: Secondary | ICD-10-CM | POA: Diagnosis not present

## 2018-12-27 DIAGNOSIS — Z1389 Encounter for screening for other disorder: Secondary | ICD-10-CM | POA: Diagnosis not present

## 2019-01-26 DIAGNOSIS — M545 Low back pain: Secondary | ICD-10-CM | POA: Diagnosis not present

## 2019-01-26 DIAGNOSIS — Z79899 Other long term (current) drug therapy: Secondary | ICD-10-CM | POA: Diagnosis not present

## 2019-01-26 DIAGNOSIS — Z1159 Encounter for screening for other viral diseases: Secondary | ICD-10-CM | POA: Diagnosis not present

## 2019-01-26 DIAGNOSIS — G8929 Other chronic pain: Secondary | ICD-10-CM | POA: Diagnosis not present

## 2019-01-26 DIAGNOSIS — R944 Abnormal results of kidney function studies: Secondary | ICD-10-CM | POA: Diagnosis not present

## 2019-01-26 DIAGNOSIS — E7800 Pure hypercholesterolemia, unspecified: Secondary | ICD-10-CM | POA: Diagnosis not present

## 2019-02-02 DIAGNOSIS — R944 Abnormal results of kidney function studies: Secondary | ICD-10-CM | POA: Diagnosis not present

## 2019-02-20 DIAGNOSIS — N183 Chronic kidney disease, stage 3 unspecified: Secondary | ICD-10-CM | POA: Diagnosis not present

## 2019-02-20 DIAGNOSIS — M545 Low back pain: Secondary | ICD-10-CM | POA: Diagnosis not present

## 2019-02-20 DIAGNOSIS — G8929 Other chronic pain: Secondary | ICD-10-CM | POA: Diagnosis not present

## 2019-02-20 DIAGNOSIS — Z79899 Other long term (current) drug therapy: Secondary | ICD-10-CM | POA: Diagnosis not present

## 2019-03-27 DIAGNOSIS — M545 Low back pain: Secondary | ICD-10-CM | POA: Diagnosis not present

## 2019-03-27 DIAGNOSIS — G8929 Other chronic pain: Secondary | ICD-10-CM | POA: Diagnosis not present

## 2019-03-27 DIAGNOSIS — Z79899 Other long term (current) drug therapy: Secondary | ICD-10-CM | POA: Diagnosis not present

## 2019-04-26 DIAGNOSIS — Z79899 Other long term (current) drug therapy: Secondary | ICD-10-CM | POA: Diagnosis not present

## 2019-04-26 DIAGNOSIS — G8929 Other chronic pain: Secondary | ICD-10-CM | POA: Diagnosis not present

## 2019-04-26 DIAGNOSIS — M545 Low back pain: Secondary | ICD-10-CM | POA: Diagnosis not present

## 2019-04-26 DIAGNOSIS — R0602 Shortness of breath: Secondary | ICD-10-CM | POA: Diagnosis not present

## 2019-05-25 DIAGNOSIS — R0602 Shortness of breath: Secondary | ICD-10-CM | POA: Diagnosis not present

## 2019-05-25 DIAGNOSIS — F17210 Nicotine dependence, cigarettes, uncomplicated: Secondary | ICD-10-CM | POA: Diagnosis not present

## 2019-05-25 DIAGNOSIS — Z008 Encounter for other general examination: Secondary | ICD-10-CM | POA: Diagnosis not present

## 2019-05-29 DIAGNOSIS — M545 Low back pain: Secondary | ICD-10-CM | POA: Diagnosis not present

## 2019-05-29 DIAGNOSIS — Z79899 Other long term (current) drug therapy: Secondary | ICD-10-CM | POA: Diagnosis not present

## 2019-05-29 DIAGNOSIS — G8929 Other chronic pain: Secondary | ICD-10-CM | POA: Diagnosis not present

## 2019-05-29 DIAGNOSIS — R7303 Prediabetes: Secondary | ICD-10-CM | POA: Diagnosis not present

## 2019-05-30 NOTE — Telephone Encounter (Signed)
disregard

## 2019-06-28 DIAGNOSIS — G8929 Other chronic pain: Secondary | ICD-10-CM | POA: Diagnosis not present

## 2019-06-28 DIAGNOSIS — M545 Low back pain: Secondary | ICD-10-CM | POA: Diagnosis not present

## 2019-06-28 DIAGNOSIS — Z79899 Other long term (current) drug therapy: Secondary | ICD-10-CM | POA: Diagnosis not present

## 2019-07-25 DIAGNOSIS — R0602 Shortness of breath: Secondary | ICD-10-CM | POA: Diagnosis not present

## 2019-07-25 DIAGNOSIS — G8929 Other chronic pain: Secondary | ICD-10-CM | POA: Diagnosis not present

## 2019-07-25 DIAGNOSIS — Z79899 Other long term (current) drug therapy: Secondary | ICD-10-CM | POA: Diagnosis not present

## 2019-07-25 DIAGNOSIS — M545 Low back pain: Secondary | ICD-10-CM | POA: Diagnosis not present

## 2019-08-28 DIAGNOSIS — M545 Low back pain: Secondary | ICD-10-CM | POA: Diagnosis not present

## 2019-08-28 DIAGNOSIS — R7303 Prediabetes: Secondary | ICD-10-CM | POA: Diagnosis not present

## 2019-08-28 DIAGNOSIS — M129 Arthropathy, unspecified: Secondary | ICD-10-CM | POA: Diagnosis not present

## 2019-08-28 DIAGNOSIS — R0602 Shortness of breath: Secondary | ICD-10-CM | POA: Diagnosis not present

## 2019-08-28 DIAGNOSIS — G8929 Other chronic pain: Secondary | ICD-10-CM | POA: Diagnosis not present

## 2019-08-28 DIAGNOSIS — Z79899 Other long term (current) drug therapy: Secondary | ICD-10-CM | POA: Diagnosis not present

## 2019-09-28 DIAGNOSIS — M545 Low back pain: Secondary | ICD-10-CM | POA: Diagnosis not present

## 2019-09-28 DIAGNOSIS — G8929 Other chronic pain: Secondary | ICD-10-CM | POA: Diagnosis not present

## 2019-09-28 DIAGNOSIS — Z79899 Other long term (current) drug therapy: Secondary | ICD-10-CM | POA: Diagnosis not present

## 2019-09-28 DIAGNOSIS — M79642 Pain in left hand: Secondary | ICD-10-CM | POA: Diagnosis not present

## 2019-09-28 DIAGNOSIS — M79641 Pain in right hand: Secondary | ICD-10-CM | POA: Diagnosis not present

## 2019-10-29 DIAGNOSIS — Z79899 Other long term (current) drug therapy: Secondary | ICD-10-CM | POA: Diagnosis not present

## 2019-10-29 DIAGNOSIS — M545 Low back pain: Secondary | ICD-10-CM | POA: Diagnosis not present

## 2019-10-29 DIAGNOSIS — G8929 Other chronic pain: Secondary | ICD-10-CM | POA: Diagnosis not present

## 2019-11-24 ENCOUNTER — Other Ambulatory Visit: Payer: Medicare Other

## 2019-11-24 ENCOUNTER — Other Ambulatory Visit: Payer: Self-pay | Admitting: Internal Medicine

## 2019-11-24 DIAGNOSIS — Z20822 Contact with and (suspected) exposure to covid-19: Secondary | ICD-10-CM

## 2020-03-05 ENCOUNTER — Telehealth: Payer: Self-pay | Admitting: *Deleted

## 2020-03-05 NOTE — Telephone Encounter (Signed)
Schedule AWV.  

## 2023-09-25 ENCOUNTER — Emergency Department (HOSPITAL_COMMUNITY): Admission: EM | Admit: 2023-09-25 | Discharge: 2023-09-25 | Discharge status: Against Medical Advice
